# Patient Record
Sex: Male | Born: 1965 | Race: White | Hispanic: No | Marital: Married | Smoking: Never smoker
Health system: Southern US, Community
[De-identification: ages and names within clinical notes are randomized; demographics above are authoritative.]

## PROBLEM LIST (undated history)

## (undated) DIAGNOSIS — K922 Gastrointestinal hemorrhage, unspecified: Secondary | ICD-10-CM

## (undated) DIAGNOSIS — K5792 Diverticulitis of intestine, part unspecified, without perforation or abscess without bleeding: Secondary | ICD-10-CM

## (undated) DIAGNOSIS — I10 Essential (primary) hypertension: Secondary | ICD-10-CM

## (undated) DIAGNOSIS — Z5189 Encounter for other specified aftercare: Secondary | ICD-10-CM

## (undated) DIAGNOSIS — Z9889 Other specified postprocedural states: Secondary | ICD-10-CM

## (undated) DIAGNOSIS — D649 Anemia, unspecified: Secondary | ICD-10-CM

## (undated) DIAGNOSIS — K219 Gastro-esophageal reflux disease without esophagitis: Secondary | ICD-10-CM

## (undated) DIAGNOSIS — R112 Nausea with vomiting, unspecified: Secondary | ICD-10-CM

## (undated) HISTORY — DX: Diverticulitis of intestine, part unspecified, without perforation or abscess without bleeding: K57.92

## (undated) HISTORY — PX: LIPOMA EXCISION: SHX5283

## (undated) HISTORY — DX: Encounter for other specified aftercare: Z51.89

## (undated) HISTORY — PX: ANKLE FRACTURE SURGERY: SHX122

## (undated) HISTORY — DX: Gastrointestinal hemorrhage, unspecified: K92.2

## (undated) HISTORY — DX: Anemia, unspecified: D64.9

---

## 2003-04-25 HISTORY — PX: VASECTOMY: SHX75

## 2012-04-24 HISTORY — PX: HYDROCELE EXCISION / REPAIR: SUR1145

## 2017-01-01 ENCOUNTER — Telehealth: Payer: Self-pay

## 2017-01-01 NOTE — Telephone Encounter (Signed)
Pt's wife called to see about getting him set up for a Screening TCS. She said that she talked to you about he having it done when you did her TCS. Please advise

## 2017-01-02 NOTE — Telephone Encounter (Signed)
TRIAGE PATIENT FOR TCS.

## 2017-01-03 NOTE — Telephone Encounter (Signed)
Gastroenterology Pre-Procedure Review  Request Date: Requesting Physician: DR. Mahalia Longest  FIRST TCS  PATIENT REVIEW QUESTIONS: The patient responded to the following health history questions as indicated:    1. Diabetes Melitis: NO 2. Joint replacements in the past 12 months: NO 3. Major health problems in the past 3 months: NO 4. Has an artificial valve or MVP: NO 5. Has a defibrillator: NO 6. Has been advised in past to take antibiotics in advance of a procedure like teeth cleaning: NO 7. Family history of colon cancer: NO 8. Alcohol Use: NO 9. History of sleep apnea: NO 10. History of coronary artery or other vascular stents placed within the last 12 months: NO 11. History of any prior anesthesia complications: NO    MEDICATIONS & ALLERGIES:    Patient reports the following regarding taking any blood thinners:   Plavix? NO Aspirin? YES Coumadin? NO Brilinta? NO Xarelto? NO Eliquis? NO Pradaxa? NO Savaysa? NO Effient? NO  Patient confirms/reports the following medications:  Current Outpatient Prescriptions  Medication Sig Dispense Refill  . amLODipine (NORVASC) 10 MG tablet Take 10 mg by mouth daily.    Marland Kitchen aspirin EC 81 MG tablet Take 81 mg by mouth 2 (two) times daily.    . nebivolol (BYSTOLIC) 10 MG tablet Take 10 mg by mouth daily.    . pantoprazole (PROTONIX) 40 MG tablet Take 40 mg by mouth daily.     No current facility-administered medications for this visit.     Patient confirms/reports the following allergies:  No Known Allergies  No orders of the defined types were placed in this encounter.   AUTHORIZATION INFORMATION Primary Insurance: Madison Hickman,  ID #: [id number],  Group #: [id number] Pre-Cert / Josem Kaufmann required:  Pre-Cert / Auth #:   SCHEDULE INFORMATION: Procedure has been scheduled as follows:  Date: , Time:   Location:   This Gastroenterology Pre-Precedure Review Form is being routed to the following provider(s): Barney Drain, MD

## 2017-01-09 NOTE — Telephone Encounter (Signed)
Forwarding to Ginger who scheduled.

## 2017-01-09 NOTE — Telephone Encounter (Signed)
LMOM to call back

## 2017-01-09 NOTE — Telephone Encounter (Signed)
FULL LIQUIDS WITH BREAKFAST. CLEANPIQ SPLIT PREP.  Full Liquid Diet A high-calorie, high-protein supplement should be used to meet your nutritional requirements when the full liquid diet is continued for more than 2 or 3 days. If this diet is to be used for an extended period of time (more than 7 days), a multivitamin should be considered.  Breads and Starches  Allowed: None are allowed   Avoid: Any others.    Potatoes/Pasta/Rice  Allowed: ANY ITEM AS A SOUP OR SMALL PLATE OF MASHED POTATOES OR SCRAMBLED EGGS. (DO NOT EAT MORE THAN ONE SERVING ON THE DAY BEFORE COLONOSCOPY).      Vegetables  Allowed: Strained tomato or vegetable juice. Vegetables pureed in soup.   Avoid: Any others.    Fruit  Allowed: Any strained fruit juices and fruit drinks. Include 1 serving of citrus or vitamin C-enriched fruit juice daily.   Avoid: Any others.  Meat and Meat Substitutes  Allowed: Egg  Avoid: Any meat, fish, or fowl. All cheese.  Milk  Allowed: SOY Milk beverages, including milk shakes and instant breakfast mixes. Smooth yogurt.   Avoid: Any others. Avoid dairy products if not tolerated.    Soups and Combination Foods  Allowed: Broth, strained cream soups. Strained, broth-based soups.   Avoid: Any others.    Desserts and Sweets  Allowed: flavored gelatin, tapioca, ice cream, sherbet, smooth pudding, junket, fruit ices, frozen ice pops, pudding pops, frozen fudge pops, chocolate syrup. Sugar, honey, jelly, syrup.   Avoid: Any others.  Fats and Oils  Allowed: Margarine, butter, cream, sour cream, oils.   Avoid: Any others.  Beverages  Allowed: All.   Avoid: None.  Condiments  Allowed: Iodized salt, pepper, spices, flavorings. Cocoa powder.   Avoid: Any others.    SAMPLE MEAL PLAN Breakfast   cup orange juice.   1 OR 2 EGGS  1 cup milk.   1 cup beverage (coffee or tea).   Cream or sugar, if desired.    Midmorning Snack  2 SCRAMBLED OR HARD  BOILED EGG   Lunch  1 cup cream soup.    cup fruit juice.   1 cup milk.    cup custard.   1 cup beverage (coffee or tea).   Cream or sugar, if desired.    Midafternoon Snack  1 cup milk shake.  Dinner  1 cup cream soup.    cup fruit juice.   1 cup MILK    cup pudding.   1 cup beverage (coffee or tea).   Cream or sugar, if desired.  Evening Snack  1 cup supplement.  To increase calories, add sugar, cream, butter, or margarine if possible. Nutritional supplements will also increase the total calories.

## 2017-01-10 ENCOUNTER — Other Ambulatory Visit: Payer: Self-pay

## 2017-01-10 DIAGNOSIS — Z1211 Encounter for screening for malignant neoplasm of colon: Secondary | ICD-10-CM

## 2017-01-10 MED ORDER — CLENPIQ 10-3.5-12 MG-GM -GM/160ML PO SOLN: 1.0000 | Freq: Once | ORAL | 0 refills | Status: AC

## 2017-01-10 NOTE — Telephone Encounter (Signed)
Pt is set up for TCS on 01/30/17 @ 1:45 pm. Wife is aware and instructions are in the mail.

## 2017-01-30 ENCOUNTER — Encounter (HOSPITAL_COMMUNITY)
Admission: RE | Disposition: A | Discharge status: Home / Self Care | Payer: Self-pay | Source: Ambulatory Visit | Attending: Gastroenterology

## 2017-01-30 ENCOUNTER — Encounter (HOSPITAL_COMMUNITY): Payer: Self-pay | Admitting: *Deleted

## 2017-01-30 ENCOUNTER — Ambulatory Visit (HOSPITAL_COMMUNITY)
Admission: RE | Admit: 2017-01-30 | Discharge: 2017-01-30 | Disposition: A | Discharge status: Home / Self Care | Payer: BLUE CROSS/BLUE SHIELD | Source: Ambulatory Visit | Attending: Gastroenterology | Admitting: Gastroenterology

## 2017-01-30 DIAGNOSIS — Z79899 Other long term (current) drug therapy: Secondary | ICD-10-CM | POA: Insufficient documentation

## 2017-01-30 DIAGNOSIS — K635 Polyp of colon: Secondary | ICD-10-CM | POA: Diagnosis not present

## 2017-01-30 DIAGNOSIS — K219 Gastro-esophageal reflux disease without esophagitis: Secondary | ICD-10-CM | POA: Diagnosis not present

## 2017-01-30 DIAGNOSIS — K5730 Diverticulosis of large intestine without perforation or abscess without bleeding: Secondary | ICD-10-CM | POA: Diagnosis not present

## 2017-01-30 DIAGNOSIS — K648 Other hemorrhoids: Secondary | ICD-10-CM | POA: Insufficient documentation

## 2017-01-30 DIAGNOSIS — Q438 Other specified congenital malformations of intestine: Secondary | ICD-10-CM | POA: Insufficient documentation

## 2017-01-30 DIAGNOSIS — Z1211 Encounter for screening for malignant neoplasm of colon: Secondary | ICD-10-CM | POA: Diagnosis not present

## 2017-01-30 DIAGNOSIS — I10 Essential (primary) hypertension: Secondary | ICD-10-CM | POA: Diagnosis not present

## 2017-01-30 DIAGNOSIS — K644 Residual hemorrhoidal skin tags: Secondary | ICD-10-CM | POA: Insufficient documentation

## 2017-01-30 DIAGNOSIS — Z7982 Long term (current) use of aspirin: Secondary | ICD-10-CM | POA: Diagnosis not present

## 2017-01-30 HISTORY — DX: Gastro-esophageal reflux disease without esophagitis: K21.9

## 2017-01-30 HISTORY — PX: COLONOSCOPY: SHX5424

## 2017-01-30 HISTORY — DX: Essential (primary) hypertension: I10

## 2017-01-30 HISTORY — PX: POLYPECTOMY: SHX5525

## 2017-01-30 SURGERY — COLONOSCOPY
Anesthesia: Moderate Sedation

## 2017-01-30 MED ORDER — MEPERIDINE HCL 100 MG/ML IJ SOLN
INTRAMUSCULAR | Status: DC | PRN
Start: 2017-01-30 — End: 2017-01-30
  Administered 2017-01-30 (×2): 50 mg

## 2017-01-30 MED ORDER — MIDAZOLAM HCL 5 MG/5ML IJ SOLN
INTRAMUSCULAR | Status: AC
  Filled 2017-01-30: qty 10

## 2017-01-30 MED ORDER — MIDAZOLAM HCL 5 MG/5ML IJ SOLN
INTRAMUSCULAR | Status: DC | PRN
  Administered 2017-01-30 (×2): 2 mg via INTRAVENOUS
  Administered 2017-01-30: 1 mg via INTRAVENOUS

## 2017-01-30 MED ORDER — SODIUM CHLORIDE 0.9 % IV SOLN: INTRAVENOUS | Status: DC

## 2017-01-30 MED ORDER — PROMETHAZINE HCL 25 MG/ML IJ SOLN
INTRAMUSCULAR | Status: AC
  Filled 2017-01-30: qty 1

## 2017-01-30 MED ORDER — PROMETHAZINE HCL 25 MG/ML IJ SOLN: INTRAMUSCULAR | Status: DC | PRN

## 2017-01-30 MED ORDER — PROMETHAZINE HCL 25 MG/ML IJ SOLN
INTRAMUSCULAR | Status: DC | PRN
  Administered 2017-01-30: 25 mg via INTRAVENOUS

## 2017-01-30 MED ORDER — MEPERIDINE HCL 100 MG/ML IJ SOLN
INTRAMUSCULAR | Status: AC
  Filled 2017-01-30: qty 2

## 2017-01-30 MED ORDER — SODIUM CHLORIDE 0.9% FLUSH
INTRAVENOUS | Status: AC
  Filled 2017-01-30: qty 10

## 2017-01-30 NOTE — Discharge Instructions (Signed)
YOU HAD ONE SMALL POLYP REMOVED. You have diverticulosis IN YOUR LEFT COLON. YOU HAVE MODERATE INTERNAL HEMORRHOIDS.   DRINK WATER TO KEEP YOUR URINE LIGHT YELLOW.  FOLLOW A HIGH FIBER DIET. AVOID ITEMS THAT CAUSE BLOATING. See info below.  YOUR BIOPSY RESULTS WILL BE AVAILABLE IN MY CHART AFTER OCT 12 AND MY OFFICE WILL CONTACT YOU IN 10-14 DAYS WITH YOUR RESULTS.   USE PREPARATION H FOUR TIMES  A DAY IF NEEDED TO RELIEVE RECTAL PAIN/PRESSURE/BLEEDING.  Next colonoscopy in 5-10 years.  Colonoscopy Care After Read the instructions outlined below and refer to this sheet in the next week. These discharge instructions provide you with general information on caring for yourself after you leave the hospital. While your treatment has been planned according to the most current medical practices available, unavoidable complications occasionally occur. If you have any problems or questions after discharge, call DR. Court Gracia, [phone].  ACTIVITY  You may resume your regular activity, but move at a slower pace for the next 24 hours.   Take frequent rest periods for the next 24 hours.   Walking will help get rid of the air and reduce the bloated feeling in your belly (abdomen).   No driving for 24 hours (because of the medicine (anesthesia) used during the test).   You may shower.   Do not sign any important legal documents or operate any machinery for 24 hours (because of the anesthesia used during the test).    NUTRITION  Drink plenty of fluids.   You may resume your normal diet as instructed by your doctor.   Begin with a light meal and progress to your normal diet. Heavy or fried foods are harder to digest and may make you feel sick to your stomach (nauseated).   Avoid alcoholic beverages for 24 hours or as instructed.    MEDICATIONS  You may resume your normal medications.   WHAT YOU CAN EXPECT TODAY  Some feelings of bloating in the abdomen.   Passage of more gas than  usual.   Spotting of blood in your stool or on the toilet paper  .  IF YOU HAD POLYPS REMOVED DURING THE COLONOSCOPY:  Eat a soft diet IF YOU HAVE NAUSEA, BLOATING, ABDOMINAL PAIN, OR VOMITING.    FINDING OUT THE RESULTS OF YOUR TEST Not all test results are available during your visit. DR. Oneida Alar WILL CALL YOU WITHIN 14 DAYS OF YOUR PROCEDUE WITH YOUR RESULTS. Do not assume everything is normal if you have not heard from DR. Alistar Mcenery, CALL HER OFFICE AT [phone].  SEEK IMMEDIATE MEDICAL ATTENTION AND CALL THE OFFICE: [phone] IF:  You have more than a spotting of blood in your stool.   Your belly is swollen (abdominal distention).   You are nauseated or vomiting.   You have a temperature over 101F.   You have abdominal pain or discomfort that is severe or gets worse throughout the day.  High-Fiber Diet A high-fiber diet changes your normal diet to include more whole grains, legumes, fruits, and vegetables. Changes in the diet involve replacing refined carbohydrates with unrefined foods. The calorie level of the diet is essentially unchanged. The Dietary Reference Intake (recommended amount) for adult males is 38 grams per day. For adult females, it is 25 grams per day. Pregnant and lactating women should consume 28 grams of fiber per day. Fiber is the intact part of a plant that is not broken down during digestion. Functional fiber is fiber that has been isolated from  the plant to provide a beneficial effect in the body. PURPOSE  Increase stool bulk.   Ease and regulate bowel movements.   Lower cholesterol.   REDUCE RISK OF COLON CANCER  INDICATIONS THAT YOU NEED MORE FIBER  Constipation and hemorrhoids.   Uncomplicated diverticulosis (intestine condition) and irritable bowel syndrome.   Weight management.   As a protective measure against hardening of the arteries (atherosclerosis), diabetes, and cancer.   GUIDELINES FOR INCREASING FIBER IN THE DIET  Start  adding fiber to the diet slowly. A gradual increase of about 5 more grams (2 slices of whole-wheat bread, 2 servings of most fruits or vegetables, or 1 bowl of high-fiber cereal) per day is best. Too rapid an increase in fiber may result in constipation, flatulence, and bloating.   Drink enough water and fluids to keep your urine clear or pale yellow. Water, juice, or caffeine-free drinks are recommended. Not drinking enough fluid may cause constipation.   Eat a variety of high-fiber foods rather than one type of fiber.   Try to increase your intake of fiber through using high-fiber foods rather than fiber pills or supplements that contain small amounts of fiber.   The goal is to change the types of food eaten. Do not supplement your present diet with high-fiber foods, but replace foods in your present diet.   INCLUDE A VARIETY OF FIBER SOURCES  Replace refined and processed grains with whole grains, canned fruits with fresh fruits, and incorporate other fiber sources. White rice, white breads, and most bakery goods contain little or no fiber.   Brown whole-grain rice, buckwheat oats, and many fruits and vegetables are all good sources of fiber. These include: broccoli, Brussels sprouts, cabbage, cauliflower, beets, sweet potatoes, white potatoes (skin on), carrots, tomatoes, eggplant, squash, berries, fresh fruits, and dried fruits.   Cereals appear to be the richest source of fiber. Cereal fiber is found in whole grains and bran. Bran is the fiber-rich outer coat of cereal grain, which is largely removed in refining. In whole-grain cereals, the bran remains. In breakfast cereals, the largest amount of fiber is found in those with "bran" in their names. The fiber content is sometimes indicated on the label.   You may need to include additional fruits and vegetables each day.   In baking, for 1 cup white flour, you may use the following substitutions:   1 cup whole-wheat flour minus 2  tablespoons.   1/2 cup white flour plus 1/2 cup whole-wheat flour.   Polyps, Colon  A polyp is extra tissue that grows inside your body. Colon polyps grow in the large intestine. The large intestine, also called the colon, is part of your digestive system. It is a long, hollow tube at the end of your digestive tract where your body makes and stores stool. Most polyps are not dangerous. They are benign. This means they are not cancerous. But over time, some types of polyps can turn into cancer. Polyps that are smaller than a pea are usually not harmful. But larger polyps could someday become or may already be cancerous. To be safe, doctors remove all polyps and test them.   PREVENTION There is not one sure way to prevent polyps. You might be able to lower your risk of getting them if you:  Eat more fruits and vegetables and less fatty food.   Do not smoke.   Avoid alcohol.   Exercise every day.   Lose weight if you are overweight.   Eating  more calcium and folate can also lower your risk of getting polyps. Some foods that are rich in calcium are milk, cheese, and broccoli. Some foods that are rich in folate are chickpeas, kidney beans, and spinach.    Diverticulosis Diverticulosis is a common condition that develops when small pouches (diverticula) form in the wall of the colon. The risk of diverticulosis increases with age. It happens more often in people who eat a low-fiber diet. Most individuals with diverticulosis have no symptoms. Those individuals with symptoms usually experience belly (abdominal) pain, constipation, or loose stools (diarrhea).  HOME CARE INSTRUCTIONS  Increase the amount of fiber in your diet as directed by your caregiver or dietician. This may reduce symptoms of diverticulosis.   Drink at least 6 to 8 glasses of water each day to prevent constipation.   Try not to strain when you have a bowel movement.   Avoiding nuts and seeds to prevent complications is  NOT NECESSARY.   FOODS HAVING HIGH FIBER CONTENT INCLUDE:  Fruits. Apple, peach, pear, tangerine, raisins, prunes.   Vegetables. Brussels sprouts, asparagus, broccoli, cabbage, carrot, cauliflower, romaine lettuce, spinach, summer squash, tomato, winter squash, zucchini.   Starchy Vegetables. Baked beans, kidney beans, lima beans, split peas, lentils, potatoes (with skin).   Grains. Whole wheat bread, brown rice, bran flake cereal, plain oatmeal, white rice, shredded wheat, bran muffins.   SEEK IMMEDIATE MEDICAL CARE IF:  You develop increasing pain or severe bloating.   You have an oral temperature above 101F.   You develop vomiting or bowel movements that are bloody or black.   Hemorrhoids Hemorrhoids are dilated (enlarged) veins around the rectum. Sometimes clots will form in the veins. This makes them swollen and painful. These are called thrombosed hemorrhoids. Causes of hemorrhoids include:  Constipation.   Straining to have a bowel movement.   HEAVY LIFTING   HOME CARE INSTRUCTIONS  Eat a well balanced diet and drink 6 to 8 glasses of water every day to avoid constipation. You may also use a bulk laxative.   Avoid straining to have bowel movements.   Keep anal area dry and clean.   Do not use a donut shaped pillow or sit on the toilet for long periods. This increases blood pooling and pain.   Move your bowels when your body has the urge; this will require less straining and will decrease pain and pressure.

## 2017-01-30 NOTE — Op Note (Signed)
[facility] Patient Name: David Roberson Procedure Date: 01/30/2017 12:57 PM MRN: [id number] Date of Birth: Apr 28, 1965 Attending MD: Barney Drain MD, MD CSN: [id number] Age: 51 Admit Type: Outpatient Procedure:                Colonoscopy WITH COLD SNARE POLYPECTOMY Indications:              Screening for colorectal malignant neoplasm Providers:                Barney Drain MD, MD, Rosina Lowenstein, RN, Lurline Del,                            RN Referring MD:             [name], MD Medicines:                Promethazine 25 mg IV, Meperidine 100 mg IV,                            Midazolam 5 mg IV Complications:            No immediate complications. Estimated Blood Loss:     Estimated blood loss was minimal. Procedure:                Pre-Anesthesia Assessment:                           - Prior to the procedure, a History and Physical                            was performed, and patient medications and                            allergies were reviewed. The patient's tolerance of                            previous anesthesia was also reviewed. The risks                            and benefits of the procedure and the sedation                            options and risks were discussed with the patient.                            All questions were answered, and informed consent                            was obtained. Prior Anticoagulants: The patient has                            taken aspirin, last dose was day of procedure. ASA                            Grade Assessment: II - A patient with mild systemic  disease. After reviewing the risks and benefits,                            the patient was deemed in satisfactory condition to                            undergo the procedure. After obtaining informed                            consent, the colonoscope was passed under direct                            vision. Throughout the procedure, the  patient's                            blood pressure, pulse, and oxygen saturations were                            monitored continuously. The EC-[id number] ([id number])                            scope was introduced through the anus and advanced                            to the the cecum, identified by appendiceal orifice                            and ileocecal valve. The colonoscopy was somewhat                            difficult due to restricted mobility of the colon.                            Successful completion of the procedure was aided by                            COLOWRAP. The patient tolerated the procedure                            fairly well. The quality of the bowel preparation                            was excellent. The ileocecal valve, appendiceal                            orifice, and rectum were photographed. Scope In: 1:49:03 PM Scope Out: 2:05:34 PM Scope Withdrawal Time: 0 hours 12 minutes 21 seconds  Total Procedure Duration: 0 hours 16 minutes 31 seconds  Findings:      A 4 mm polyp was found in the proximal ascending colon. The polyp was       sessile. The polyp was removed with a hot snare. Resection and retrieval       were complete.      Multiple small and large-mouthed diverticula  were found in the       recto-sigmoid colon, sigmoid colon and descending colon.      External and internal hemorrhoids were found during retroflexion. The       hemorrhoids were moderate.      The recto-sigmoid colon was significantly redundant. Impression:               - One 4 mm polyp in the proximal ascending colon,                            removed with a hot snare. Resected and retrieved.                           - Diverticulosis in the recto-sigmoid colon, in the                            sigmoid colon and in the descending colon.                           - External and internal hemorrhoids.                           - REDUNDANT FIXED RECTOSIGMOID COLON Moderate  Sedation:      Moderate (conscious) sedation was administered by the endoscopy nurse       and supervised by the endoscopist. The following parameters were       monitored: oxygen saturation, heart rate, blood pressure, and response       to care. Total physician intraservice time was 28 minutes. Recommendation:           - Repeat colonoscopy in 5-10 years for surveillance                            WITH COLOWRAP.                           - High fiber diet.                           - Continue present medications.                           - Await pathology results.                           - Patient has a contact number available for                            emergencies. The signs and symptoms of potential                            delayed complications were discussed with the                            patient. Return to normal activities tomorrow.  Written discharge instructions were provided to the                            patient. Procedure Code(s):        --- Professional ---                           [phone], Colonoscopy, flexible; with removal of                            tumor(s), polyp(s), or other lesion(s) by snare                            technique                           99152, Moderate sedation services provided by the                            same physician or other qualified health care                            professional performing the diagnostic or                            therapeutic service that the sedation supports,                            requiring the presence of an independent trained                            observer to assist in the monitoring of the                            patient's level of consciousness and physiological                            status; initial 15 minutes of intraservice time,                            patient age 24 years or older                           [phone], Moderate sedation  services; each additional                            15 minutes intraservice time Diagnosis Code(s):        --- Professional ---                           Z12.11, Encounter for screening for malignant                            neoplasm of colon  D12.2, Benign neoplasm of ascending colon                           K64.8, Other hemorrhoids                           K57.30, Diverticulosis of large intestine without                            perforation or abscess without bleeding CPT copyright 2016 [employer]. All rights reserved. The codes documented in this report are preliminary and upon coder review may  be revised to meet current compliance requirements. Barney Drain, MD Barney Drain MD, MD 01/30/2017 2:17:24 PM This report has been signed electronically. Number of Addenda: 0

## 2017-01-30 NOTE — H&P (Signed)
Primary Care Physician:  System, Pcp Not In Primary Gastroenterologist:  Dr. Oneida Alar  Pre-Procedure History & Physical: HPI:  David Roberson is a 51 y.o. male here for COLON CANCER SCREENING.  Past Medical History:  Diagnosis Date  . GERD (gastroesophageal reflux disease)   . Hypertension     Past Surgical History:  Procedure Laterality Date  . ANKLE FRACTURE SURGERY Left   . HYDROCELE EXCISION / REPAIR  2014  . LIPOMA EXCISION     Neck  . VASECTOMY  2005    Prior to Admission medications   Medication Sig Start Date End Date Taking? Authorizing Provider  amLODipine (NORVASC) 10 MG tablet Take 10 mg by mouth daily.   Yes [provider]  aspirin EC 81 MG tablet Take 81 mg by mouth 2 (two) times daily.   Yes [provider]  Cholecalciferol (VITAMIN D3) 400 units CHEW Chew 1 tablet by mouth daily.   Yes [provider]  hydroxyurea (HYDREA) 500 MG capsule Take 500 mg by mouth daily. May take with food to minimize GI side effects.   Yes [provider]  nebivolol (BYSTOLIC) 10 MG tablet Take 10 mg by mouth daily.   Yes [provider]  vitamin E (VITAMIN E) 400 UNIT capsule Take 400 Units by mouth daily.   Yes [provider]  pantoprazole (PROTONIX) 40 MG tablet Take 40 mg by mouth daily.    [provider]    Allergies as of 01/10/2017  . (No Known Allergies)    Family History  Problem Relation Age of Onset  . Hypertension Mother   . Hypertension Father   . Stroke Paternal Grandmother   . Heart disease Paternal Grandfather     Social History   Social History  . Marital status: Married    Spouse name: N/A  . Number of children: N/A  . Years of education: N/A   Occupational History  . Not on file.   Social History Main Topics  . Smoking status: Never Smoker  . Smokeless tobacco: Current User    Types: Chew  . Alcohol use Yes     Comment: 5-6pk. a daily  . Drug use: No  . Sexual activity: Not on  file   Other Topics Concern  . Not on file   Social History Narrative  . No narrative on file    Review of Systems: See HPI, otherwise negative ROS   Physical Exam: There were no vitals taken for this visit. General:   Alert,  pleasant and cooperative in NAD Head:  Normocephalic and atraumatic. Neck:  Supple; Lungs:  Clear throughout to auscultation.    Heart:  Regular rate and rhythm. Abdomen:  Soft, nontender and nondistended. Normal bowel sounds, without guarding, and without rebound.   Neurologic:  Alert and  oriented x4;  grossly normal neurologically.  Impression/Plan:     SCREENING  Plan:  1. TCS TODAY DISCUSSED PROCEDURE, BENEFITS, & RISKS: < 1% chance of medication reaction, bleeding, perforation, or rupture of spleen/liver.

## 2017-01-30 NOTE — Progress Notes (Signed)
Patient complains of being hot and is sweaty. He is also confused at times. Vital signs taken and Dr. Oneida Alar notified. Dr. Oneida Alar states it is likely a side effect of Phenergan. No further orders at this time. Will continue to monitor.

## 2017-02-02 ENCOUNTER — Encounter (HOSPITAL_COMMUNITY): Payer: Self-pay | Admitting: Gastroenterology

## 2017-02-14 ENCOUNTER — Telehealth: Payer: Self-pay | Admitting: Gastroenterology

## 2017-02-14 NOTE — Telephone Encounter (Signed)
Please call pt. HE had ONE simple adenoma removed BUT NOT RETRIEVED. FOLLOW A HIGH FIBER DIET. NEXT TCS IN 5-10.

## 2017-02-15 NOTE — Telephone Encounter (Signed)
Patient made aware of results and recommendations.  Please nic repeat tcs in 5 years.

## 2017-02-15 NOTE — Telephone Encounter (Signed)
ON RECALL FOR 5 YR TCS

## 2017-02-19 NOTE — Telephone Encounter (Signed)
David Roberson, please fax tcs report to Dr. Mahalia Longest [phone]

## 2017-02-20 NOTE — Telephone Encounter (Signed)
FAXED

## 2020-07-26 ENCOUNTER — Encounter (HOSPITAL_COMMUNITY): Payer: Self-pay | Admitting: Surgery

## 2020-07-26 ENCOUNTER — Other Ambulatory Visit: Payer: Self-pay

## 2020-07-27 ENCOUNTER — Inpatient Hospital Stay (HOSPITAL_COMMUNITY): Payer: 59

## 2020-07-27 ENCOUNTER — Other Ambulatory Visit: Payer: Self-pay

## 2020-07-27 ENCOUNTER — Inpatient Hospital Stay (HOSPITAL_COMMUNITY): Payer: 59 | Attending: Hematology | Admitting: Hematology

## 2020-07-27 VITALS — BP 167/90 | HR 56 | Temp 98.4°F | Resp 18 | Ht 71.0 in | Wt 211.5 lb

## 2020-07-27 DIAGNOSIS — D72829 Elevated white blood cell count, unspecified: Secondary | ICD-10-CM | POA: Insufficient documentation

## 2020-07-27 DIAGNOSIS — D509 Iron deficiency anemia, unspecified: Secondary | ICD-10-CM | POA: Insufficient documentation

## 2020-07-27 DIAGNOSIS — D75839 Thrombocytosis, unspecified: Secondary | ICD-10-CM

## 2020-07-27 DIAGNOSIS — D72825 Bandemia: Secondary | ICD-10-CM

## 2020-07-27 DIAGNOSIS — D696 Thrombocytopenia, unspecified: Secondary | ICD-10-CM | POA: Insufficient documentation

## 2020-07-27 DIAGNOSIS — D471 Chronic myeloproliferative disease: Secondary | ICD-10-CM

## 2020-07-27 DIAGNOSIS — R768 Other specified abnormal immunological findings in serum: Secondary | ICD-10-CM

## 2020-07-27 LAB — CBC WITH DIFFERENTIAL/PLATELET
Abs Immature Granulocytes: 0.79 10*3/uL — ABNORMAL HIGH (ref 0.00–0.07)
Basophils Absolute: 1.2 10*3/uL — ABNORMAL HIGH (ref 0.0–0.1)
Basophils Relative: 6 %
Eosinophils Absolute: 0.6 10*3/uL — ABNORMAL HIGH (ref 0.0–0.5)
Eosinophils Relative: 3 %
HCT: 40.7 % (ref 39.0–52.0)
Hemoglobin: 10.7 g/dL — ABNORMAL LOW (ref 13.0–17.0)
Immature Granulocytes: 4 %
Lymphocytes Relative: 15 %
Lymphs Abs: 2.8 10*3/uL (ref 0.7–4.0)
MCH: 16.3 pg — ABNORMAL LOW (ref 26.0–34.0)
MCHC: 26.3 g/dL — ABNORMAL LOW (ref 30.0–36.0)
MCV: 62 fL — ABNORMAL LOW (ref 80.0–100.0)
Monocytes Absolute: 1.4 10*3/uL — ABNORMAL HIGH (ref 0.1–1.0)
Monocytes Relative: 8 %
Neutro Abs: 11.9 10*3/uL — ABNORMAL HIGH (ref 1.7–7.7)
Neutrophils Relative %: 64 %
Platelets: 872 10*3/uL — ABNORMAL HIGH (ref 150–400)
RBC: 6.56 MIL/uL — ABNORMAL HIGH (ref 4.22–5.81)
RDW: 23 % — ABNORMAL HIGH (ref 11.5–15.5)
WBC: 18.6 10*3/uL — ABNORMAL HIGH (ref 4.0–10.5)
nRBC: 0.5 % — ABNORMAL HIGH (ref 0.0–0.2)

## 2020-07-27 LAB — COMPREHENSIVE METABOLIC PANEL
ALT: 27 U/L (ref 0–44)
AST: 24 U/L (ref 15–41)
Albumin: 4.4 g/dL (ref 3.5–5.0)
Alkaline Phosphatase: 67 U/L (ref 38–126)
Anion gap: 10 (ref 5–15)
BUN: 18 mg/dL (ref 6–20)
CO2: 23 mmol/L (ref 22–32)
Calcium: 9.1 mg/dL (ref 8.9–10.3)
Chloride: 105 mmol/L (ref 98–111)
Creatinine, Ser: 1.04 mg/dL (ref 0.61–1.24)
GFR, Estimated: >60 mL/min (ref >60)
Glucose, Bld: 88 mg/dL (ref 70–99)
Potassium: 4.7 mmol/L (ref 3.5–5.1)
Sodium: 138 mmol/L (ref 135–145)
Total Bilirubin: 0.7 mg/dL (ref 0.3–1.2)
Total Protein: 6.9 g/dL (ref 6.5–8.1)

## 2020-07-27 LAB — IRON AND TIBC
Iron: 21 ug/dL — ABNORMAL LOW (ref 45–182)
Saturation Ratios: 4 % — ABNORMAL LOW (ref 17.9–39.5)
TIBC: 528 ug/dL — ABNORMAL HIGH (ref 250–450)
UIBC: 507 ug/dL

## 2020-07-27 LAB — FERRITIN: Ferritin: 3 ng/mL — ABNORMAL LOW (ref 24–336)

## 2020-07-27 LAB — VITAMIN B12: Vitamin B-12: 1173 pg/mL — ABNORMAL HIGH (ref 180–914)

## 2020-07-27 LAB — LACTATE DEHYDROGENASE: LDH: 361 U/L — ABNORMAL HIGH (ref 98–192)

## 2020-07-27 LAB — FOLATE: Folate: 6.4 ng/mL (ref >5.9)

## 2020-07-27 NOTE — Patient Instructions (Signed)
[place] at [facility] Discharge Instructions  You were seen today by Dr. Delton Coombes and Tarri Abernethy PA-C for your thrombocytosis (elevated platelets), anemia (low hemoglobin), and possible myelofibrosis (bone marrow disorder).  We would like to run several other tests to decide on future treatment plans.    LABS: Labs today before leaving [employer]   OTHER TESTS:  - CT abdomen and pelvis - Bone marrow biopsy at [place]:  - CHANGE the dose of your Asprin to 81 mg (instead of 325 mg) - Discuss with Dr. Volanda Napoleon (neurology) if you still need to be on Plavix  FOLLOW-UP APPOINTMENT: Return for follow up appointment after your bone marrow biopsy and CT abdomen   Thank you for choosing [place] at [facility] to provide your oncology and hematology care.  To afford each patient quality time with our provider, please arrive at least 15 minutes before your scheduled appointment time.   If you have a lab appointment with the [facility] please come in thru the Main Entrance and check in at the main information desk.  You need to re-schedule your appointment should you arrive 10 or more minutes late.  We strive to give you quality time with our providers, and arriving late affects you and other patients whose appointments are after yours.  Also, if you no show three or more times for appointments you may be dismissed from the clinic at the providers discretion.     Again, thank you for choosing [facility].  Our hope is that these requests will decrease the amount of time that you wait before being seen by our physicians.       _____________________________________________________________  Should you have questions after your visit to [facility], please contact our office at [phone] and follow the prompts.  Our office hours are 8:00 a.m. and 4:30 p.m. Monday - Friday.  Please note that  voicemails left after 4:00 p.m. may not be returned until the following business day.  We are closed weekends and major holidays.  You do have access to a nurse 24-7, just call the main number to the clinic [phone] and do not press any options, hold on the line and a nurse will answer the phone.    For prescription refill requests, have your pharmacy contact our office and allow 72 hours.    Due to Covid, you will need to wear a mask upon entering the hospital. If you do not have a mask, a mask will be given to you at the Main Entrance upon arrival. For doctor visits, patients may have 1 support person age 8 or older with them. For treatment visits, patients can not have anyone with them due to social distancing guidelines and our immunocompromised population.

## 2020-07-27 NOTE — Progress Notes (Signed)
[name], [place] [address]   CLINIC:  Medical Oncology/Hematology  CONSULT NOTE  Patient Care Team: [name] as PCP - General (Internal Medicine)  CHIEF COMPLAINTS/PURPOSE OF CONSULTATION:  Essential thrombocytosis  HISTORY OF PRESENTING ILLNESS:  David Roberson 55 y.o. male is here because of thrombocytosis, referred by [facility].  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  Bone marrow biopsy in November 2018 (evaluated by hematopathologist at [facility]) showed 60% cellularity with atypical megakaryocytes, reticulin stain shows increased marrow fibrosis indicative of early phase primary myelofibrosis, kappa predominant increase in plasma cells (5%).  Patient was briefly on Hydroxyurea for less than 2 months in 2018, has been on long-term Aspirin since diagnosis.  Patient was unfortunately lost to follow-up after December 2018.  He presents today due to persistent abnormalities noted in CBC obtained on 07/09/2020, which showed hemoglobin 10.8, but with increased RBC 6.69 and low MCV 60.0, RDW 22.1.  Polychromasia, elliptocytes, and spherocytes were present.  CBC also noted WBC 20.2 (neutrophilia with neutrophils 12.9) with platelets 880, possibly in the setting of acute illness/abdominal complaints (discharged on Cipro for possible diverticulitis).  In the interim history since he was last seen by a hematologist, [name] experienced a severe GI bleed in September 2021, with Hgb < 6.0, required RBC transfusion x2.  GI bleed was secondary to duodenal ulcers related to overuse of NSAIDs, resolved s/p clipping of duodenal ulcers.  At the time of GI bleeding, patient had multiple TIAs.  CTA neck obtained on 01/07/2020 showed interval occlusion of the distal end of left vertebral arterty (compared to CTA head on  01/05/2020), which was concerning for thrombosis as well as moderate to high-grade vertebral artery stenosis.   MRA neck (01/07/2020) showed nonopacification of the bilateral V4 segments of the vertebral arteries concerning for occlusions.  MRI brain (01/07/2020) showed acute ischemia in the brainstem and cerebellum consistent with vertebral artery occlusion/stenosis.  Due to acute CVA, patient was placed on Plavix as well as aspirin 325 mg.  Over the past six months since his GI bleed and CVAs, patient has been slowly improving.  He reports persistent fatigue with energy level 60%, but improved from September.  He denies any current signs or symptoms of blood loss, no hematemesis, hematochezia, melena.  He reports that he has had abnormal cravings for oranges since his GI bleed, reports that he did not previously eat much citrus.  He continues to have small "episodes" that are similar to his TIAs, he had one episode in February 2022, described as a burning sensation is in his left eye which is his typical prodromal symptom before having mild left arm numbness which self-resolved.  He also reports aquagenic pruritus, and a feeling of "crawling under his skin," most noted after a hot shower.  Vasomotor symptoms also evident in burning sensation on the bottom of his feet, which improved after he restarted aspirin.  He denies vision changes and changes in the color of his skin.  No thrombotic events apart from TIA as noted above.  No new onset or worsening peripheral edema.  He denies new bone pain or recent fractures.  He does report early satiety for the past 2 years.  He denies recent chest pain on exertion, shortness of breath on minimal exertion, pre-syncopal episodes, or palpitations.  He denies B-symptoms such as fever, chills, weight  loss, and night sweats.  No new masses or lymphadenopathy per his report.  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  He has hypertension.  He is a non-smoker, but previously used chewing tobacco.  No alcohol or illicit drug use.  He is retired from work as a Engineer, structural.  MEDICAL HISTORY:  Past Medical History:  Diagnosis Date  . Anemia   . Blood transfusion without reported diagnosis   . Diverticulitis   . GERD (gastroesophageal reflux disease)   . GI bleed   . Hypertension   . PONV (postoperative nausea and vomiting)    with colonoscopy    SURGICAL HISTORY: Past Surgical History:  Procedure Laterality Date  . ANKLE FRACTURE SURGERY Left   . COLONOSCOPY N/A 01/30/2017   Procedure: COLONOSCOPY;  Surgeon: Danie Binder, MD;  Location: [facility];  Service: Endoscopy;  Laterality: N/A;  145  . HYDROCELE EXCISION / REPAIR  2014  . LIPOMA EXCISION     Neck  . POLYPECTOMY  01/30/2017   Procedure: POLYPECTOMY;  Surgeon: Danie Binder, MD;  Location: [facility];  Service: Endoscopy;;  Ascending colon  . VASECTOMY  2005    SOCIAL HISTORY: Social History   Socioeconomic History  . Marital status: Married    Spouse name: Not on file  . Number of children: 4  . Years of education: Not on file  . Highest education level: Not on file  Occupational History  . Occupation: Retired  Tobacco Use  . Smoking status: Never Smoker  . Smokeless tobacco: Former Systems developer    Types: Secondary school teacher  . Vaping Use: Never used  Substance and Sexual Activity  . Alcohol use: Yes    Comment: maybe 1 beer a night  . Drug use: No  . Sexual activity: Yes  Other Topics Concern  . Not on file  Social History Narrative  . Not on file   Social Determinants of Health   Financial Resource Strain: Low Risk   . Difficulty of Paying Living Expenses: Not hard at all  Food Insecurity: No Food Insecurity  . Worried About Charity fundraiser in the Last Year: Never true  . Ran Out of  Food in the Last Year: Never true  Transportation Needs: No Transportation Needs  . Lack of Transportation (Medical): No  . Lack of Transportation (Non-Medical): No  Physical Activity: Insufficiently Active  . Days of Exercise per Week: 2 days  . Minutes of Exercise per Session: 30 min  Stress: No Stress Concern Present  . Feeling of Stress : Not at all  Social Connections: Moderately Isolated  . Frequency of Communication with Friends and Family: More than three times a week  . Frequency of Social Gatherings with Friends and Family: Three times a week  . Attends Religious Services: Never  . Active Member of Clubs or Organizations: No  . Attends Archivist Meetings: Never  . Marital Status: Married  Human resources officer Violence: Not At Risk  . Fear of Current or Ex-Partner: No  . Emotionally Abused: No  . Physically Abused: No  . Sexually Abused: No    FAMILY HISTORY: Family History  Problem Relation Age of Onset  . Hypertension Mother   . Hypertension Father   . Stroke Paternal Grandmother   .  Heart disease Paternal Grandfather     ALLERGIES:  is allergic to hydralazine, losartan, metronidazole, and piperacillin sod-tazobactam so.  MEDICATIONS:  Current Outpatient Medications  Medication Sig Dispense Refill  . ALPRAZolam (XANAX) 0.25 MG tablet Take 1 tablet by mouth as needed.    Marland Kitchen amLODipine (NORVASC) 10 MG tablet Take 10 mg by mouth daily.    Mar[place] Kitchen aspirin 325 MG tablet Take 325 mg by mouth daily.    . clopidogrel (PLAVIX) 75 MG tablet Take 1 tablet by mouth daily.    Marland Kitchen guanFACINE (INTUNIV) 1 MG TB24 ER tablet Take 1 tablet by mouth daily.    . Nebivolol HCl 20 MG TABS Take 1 tablet by mouth daily.    . NP THYROID 60 MG tablet Take 60 mg by mouth daily.    . pantoprazole (PROTONIX) 40 MG tablet Take 40 mg by mouth daily.    . rosuvastatin (CRESTOR) 40 MG tablet Take 1 tablet by mouth daily.    . Vitamin D, Cholecalciferol, 25 MCG (1000 UT) TABS Take 1,000 Units by  mouth daily.    . Omega-3 Fatty Acids (FISH OIL) 1000 MG CAPS Take by mouth.    Marland Kitchen tiZANidine (ZANAFLEX) 4 MG tablet Take 4 mg by mouth 3 (three) times daily.     No current facility-administered medications for this visit.    REVIEW OF SYSTEMS:   Review of Systems  Constitutional: Positive for fatigue. Negative for appetite change, chills, diaphoresis, fever and unexpected weight change.  HENT:   Negative for lump/mass and nosebleeds.   Eyes: Negative for eye problems.  Respiratory: Negative for cough, hemoptysis and shortness of breath.   Cardiovascular: Negative for chest pain, leg swelling and palpitations.  Gastrointestinal: Negative for abdominal pain, blood in stool, constipation, diarrhea, nausea and vomiting.       Early satiety  Genitourinary: Negative for hematuria.   Skin: Negative.   Neurological: Negative for dizziness, headaches and light-headedness.  Hematological: Does not bruise/bleed easily.      PHYSICAL EXAMINATION: ECOG PERFORMANCE STATUS: 1 - Symptomatic but completely ambulatory  Vitals:   07/27/20 1258  BP: (!) 167/90  Pulse: (!) 56  Resp: 18  Temp: 98.4 F (36.9 C)  SpO2: 99%   Filed Weights   07/27/20 1258  Weight: 211 lb 8 oz (95.9 kg)    Physical Exam Constitutional:      Appearance: Normal appearance.  HENT:     Head: Normocephalic and atraumatic.     Mouth/Throat:     Mouth: Mucous membranes are moist.  Eyes:     Extraocular Movements: Extraocular movements intact.     Pupils: Pupils are equal, round, and reactive to light.  Cardiovascular:     Rate and Rhythm: Normal rate and regular rhythm.     Pulses: Normal pulses.     Heart sounds: Normal heart sounds.  Pulmonary:     Effort: Pulmonary effort is normal.     Breath sounds: Normal breath sounds.  Abdominal:     General: Bowel sounds are normal.     Palpations: Abdomen is soft.     Tenderness: There is no abdominal tenderness.  Musculoskeletal:        General: No swelling.      Right lower leg: No edema.     Left lower leg: No edema.  Lymphadenopathy:     Cervical: No cervical adenopathy.  Skin:    General: Skin is warm and dry.  Neurological:     General: No focal deficit  present.     Mental Status: He is alert and oriented to person, place, and time.  Psychiatric:        Mood and Affect: Mood normal.        Behavior: Behavior normal.      LABORATORY DATA:  I have extensively reviewed the laboratory data sent by referring provider and via [place], as documented elsewhere in this note   RADIOGRAPHIC STUDIES: I have extensively reviewed the radiology reports via [place], as elsewhere summarized in this note.  ASSESSMENT: 1.  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   -Bone marrow biopsy in November 2018 (evaluated by hematopathologist at [facility]) showed 60% cellularity with atypical megakaryocytes, reticulin stain shows increased marrow fibrosis indicative of early phase primary myelofibrosis, kappa predominant increase in plasma cells (5%).   -Patient was briefly on Hydroxyurea for less than 2 months in 2018, has been on long-term Aspirin since diagnosis.   -Patient was unfortunately lost to follow-up after December 2018 -Patient had multiple TIAs in September 2021, unclear if this is due to severe anemia due to concurrent GI bleeding, vertebral artery stenosis, or thrombosis related to essential thrombocytosis -Vasomotor symptoms include burning sensation of bilateral plantar feet, improved after restarting aspirin -WBC 18.6 (absolute neutrophils 11.9, absolute basophils 1.2), RBC 6.56, hemoglobin 10.7, MCV 62.0, platelets 872 -LDH elevated at 361, normal folate 6.3, elevated B12 1,173  2.  Microcytic anemia -Hemoglobin was previously  noted to be normal, Hgb 17.3 in January 2021, but with low MCV 72.0 -Patient had severe GI bleed (duodenal ulcer secondary to NSAID use) in September 2021, with hemoglobin < 6.0, required RBC transfusion x2 -Hemoglobin has not yet fully recovered in the 6 months following GI bleed -Labs today (07/27/2020) hemoglobin 10.7, MCV 62.0 -Severe iron deficiency with ferritin 3, iron saturation 4%, serum iron 21 -LDH elevated at 361, normal folate 6.3, elevated B12 1,173    PLAN:  1.  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  Microcytic anemia -Iron studies obtained today significant for severe iron deficiency as noted above -Pending labs include copper, SPEP, FLC, methylmalonic acid, IFE -Check SPEP and free light chains, in view of persistent anemia and slightly increased plasma cells on bone marrow biopsy in 2018 -Schedule patient for IV iron infusion -Suspect the patient may have some underlying thalassemia, due to low MCV 60.0 out of proportion to level of low hemoglobin 10.8 (as well as increased RBC 6.69) - we will check hemoglobin electrophoresis after iron has been normalized -Referral for bone marrow biopsy as above -Obtain skeletal survey due to elevated free light chains -We will repeat iron panel and CBC after next appointment   PLAN SUMMARY & DISPOSITION: -Labs today -CT abdomen -Bone marrow biopsy -RTC after bone marrow biopsy  All  questions were answered. The patient knows to call the clinic with  any problems, questions or concerns.   Medical decision making: Moderate (chronic problem with exacerbation requiring further work-up, review of external notes, review of previous results, ordering new tests)  Time spent on visit: I spent 40 minutes counseling the patient face to face. The total time spent in the appointment was 55 minutes and more than 50% was on counseling.  I, Tarri Abernethy PA-C, have seen this patient in conjunction with Dr. Derek Jack. Greater than 50% of visit was performed by Dr. Delton Coombes.  Addendum: I have independently evaluated this patient and agree with HPI written by Casey Burkitt, PA-C.  This patient has a history of JAK2 positive myeloproliferative disorder, initially presented as thrombocytosis.  As he had no risk factors, he was not started on hydroxyurea.  He presents with severe microcytic anemia with leukocytosis and thrombocytosis.  He might also have underlying thalassemia trait.  Because of significant changes in his blood counts, I have recommended bone marrow aspiration and biopsy to evaluate for any progression of his reticulin fibrosis in the bone marrow.  We will see him back after the bone marrow biopsy.    Derek Jack, MD 08/21/20 1:49 PM  NOTE:  On 07/30/2020, I Eugene Garnet Pennington PA-C) spoke at length with Dr. Rip Harbour (the patient's neurologist) to discuss Mr. Gali prior Margretta Sidle and CTA Head/Neck results from September 2021.  Per our discussion, the exact etiology of the patient's TIAs is likely mixed.  He certainly has some atherosclerotic changes of his basilar artery and vertebral arteries, which may have been exacerbated due to hypovolemia in the setting of acute GI bleed, as well as possible thrombotic event in the setting of essential thrombocytosis.  As discussed with Dr. Volanda Napoleon, we would like the patient to remain on aspirin, but will defer  to him regarding Plavix.

## 2020-07-28 LAB — KAPPA/LAMBDA LIGHT CHAINS
Kappa free light chain: 354.4 mg/L — ABNORMAL HIGH (ref 3.3–19.4)
Kappa, lambda light chain ratio: 59.07 — ABNORMAL HIGH (ref 0.26–1.65)
Lambda free light chains: 6 mg/L (ref 5.7–26.3)

## 2020-07-29 LAB — PROTEIN ELECTROPHORESIS, SERUM
A/G Ratio: 1.6 (ref 0.7–1.7)
Albumin ELP: 4 g/dL (ref 2.9–4.4)
Alpha-1-Globulin: 0.2 g/dL (ref 0.0–0.4)
Alpha-2-Globulin: 0.8 g/dL (ref 0.4–1.0)
Beta Globulin: 1 g/dL (ref 0.7–1.3)
Gamma Globulin: 0.5 g/dL (ref 0.4–1.8)
Globulin, Total: 2.5 g/dL (ref 2.2–3.9)
Total Protein ELP: 6.5 g/dL (ref 6.0–8.5)

## 2020-07-30 LAB — METHYLMALONIC ACID, SERUM: Methylmalonic Acid, Quantitative: 188 nmol/L (ref 0–378)

## 2020-07-30 LAB — COPPER, SERUM: Copper: 77 ug/dL (ref 69–132)

## 2020-08-01 LAB — JAK2 V617F, W REFLEX TO CALR/E12/MPL

## 2020-08-02 ENCOUNTER — Other Ambulatory Visit: Payer: Self-pay

## 2020-08-02 ENCOUNTER — Ambulatory Visit (HOSPITAL_COMMUNITY): Payer: 59

## 2020-08-02 ENCOUNTER — Inpatient Hospital Stay (HOSPITAL_COMMUNITY): Payer: 59

## 2020-08-02 ENCOUNTER — Encounter (HOSPITAL_COMMUNITY): Payer: Self-pay

## 2020-08-02 VITALS — BP 138/78 | HR 50 | Temp 96.8°F | Resp 18

## 2020-08-02 DIAGNOSIS — R768 Other specified abnormal immunological findings in serum: Secondary | ICD-10-CM

## 2020-08-02 DIAGNOSIS — D509 Iron deficiency anemia, unspecified: Secondary | ICD-10-CM | POA: Diagnosis not present

## 2020-08-02 MED ORDER — ACETAMINOPHEN 325 MG PO TABS
ORAL_TABLET | ORAL | Status: AC
  Filled 2020-08-02: qty 2

## 2020-08-02 MED ORDER — DIPHENHYDRAMINE HCL 25 MG PO CAPS
ORAL_CAPSULE | ORAL | Status: AC
  Filled 2020-08-02: qty 1

## 2020-08-02 MED ORDER — DIPHENHYDRAMINE HCL 25 MG PO CAPS
25.0000 mg | ORAL_CAPSULE | Freq: Once | ORAL | Status: AC
  Administered 2020-08-02: 25 mg via ORAL

## 2020-08-02 MED ORDER — ACETAMINOPHEN 325 MG PO TABS
650.0000 mg | ORAL_TABLET | Freq: Once | ORAL | Status: AC
  Administered 2020-08-02: 650 mg via ORAL

## 2020-08-02 MED ORDER — SODIUM CHLORIDE 0.9 % IV SOLN
300.0000 mg | Freq: Once | INTRAVENOUS | Status: AC
  Administered 2020-08-02: 300 mg via INTRAVENOUS
  Filled 2020-08-02: qty 15

## 2020-08-02 MED ORDER — SODIUM CHLORIDE 0.9 % IV SOLN: Freq: Once | INTRAVENOUS | Status: AC

## 2020-08-02 NOTE — Progress Notes (Signed)
Patient tolerated iron infusion with no complaints voiced.  Peripheral IV site clean and dry with good blood return noted before and after infusion.  Band aid applied.  VSS with discharge and left in satisfactory condition with no s/s of distress noted.   

## 2020-08-02 NOTE — Patient Instructions (Signed)
[place] at [facility]  Discharge Instructions:  You received your venofer infusion today, return as scheduled. _______________________________________________________________  Thank you for choosing [place] at [facility] to provide your oncology and hematology care.  To afford each patient quality time with our providers, please arrive at least 15 minutes before your scheduled appointment.  You need to re-schedule your appointment if you arrive 10 or more minutes late.  We strive to give you quality time with our providers, and arriving late affects you and other patients whose appointments are after yours.  Also, if you no show three or more times for appointments you may be dismissed from the clinic.  Again, thank you for choosing [place] at [place] hope is that these requests will allow you access to exceptional care and in a timely manner. _______________________________________________________________  If you have questions after your visit, please contact our office at [phone] between the hours of 8:30 a.m. and 5:00 p.m. Voicemails left after 4:30 p.m. will not be returned until the following business day. _______________________________________________________________  For prescription refill requests, have your pharmacy contact our office. _______________________________________________________________  Recommendations made by the consultant and any test results will be sent to your referring physician. _______________________________________________________________

## 2020-08-02 NOTE — Progress Notes (Signed)
Patient presents today for 300mg  Venofer infusion. Vital signs stable. Patient has complaints of new pain on the Right leg that he wanted to let the RPennington PA know about. New onset. PA notified and at the bedside to speak with patient.

## 2020-08-04 LAB — IMMUNOFIXATION ELECTROPHORESIS
IgA: 64 mg/dL — ABNORMAL LOW (ref 90–386)
IgG (Immunoglobin G), Serum: 487 mg/dL — ABNORMAL LOW (ref 603–1613)
IgM (Immunoglobulin M), Srm: 20 mg/dL (ref 20–172)
Total Protein ELP: 6.9 g/dL (ref 6.0–8.5)

## 2020-08-09 ENCOUNTER — Other Ambulatory Visit: Payer: Self-pay

## 2020-08-09 ENCOUNTER — Encounter (HOSPITAL_COMMUNITY): Payer: Self-pay

## 2020-08-09 ENCOUNTER — Inpatient Hospital Stay (HOSPITAL_COMMUNITY): Payer: 59

## 2020-08-09 VITALS — BP 128/84 | HR 58 | Temp 98.3°F | Resp 18

## 2020-08-09 DIAGNOSIS — D509 Iron deficiency anemia, unspecified: Secondary | ICD-10-CM

## 2020-08-09 MED ORDER — SODIUM CHLORIDE 0.9 % IV SOLN
300.0000 mg | Freq: Once | INTRAVENOUS | Status: AC
  Administered 2020-08-09: 300 mg via INTRAVENOUS
  Filled 2020-08-09: qty 15

## 2020-08-09 MED ORDER — SODIUM CHLORIDE 0.9 % IV SOLN: Freq: Once | INTRAVENOUS | Status: AC

## 2020-08-09 MED ORDER — SODIUM CHLORIDE 0.9% FLUSH
10.0000 mL | Freq: Once | INTRAVENOUS | Status: AC | PRN
  Administered 2020-08-09: 10 mL

## 2020-08-09 MED ORDER — DIPHENHYDRAMINE HCL 25 MG PO CAPS
25.0000 mg | ORAL_CAPSULE | Freq: Once | ORAL | Status: AC
  Administered 2020-08-09: 25 mg via ORAL
  Filled 2020-08-09: qty 1

## 2020-08-09 MED ORDER — ACETAMINOPHEN 325 MG PO TABS
650.0000 mg | ORAL_TABLET | Freq: Once | ORAL | Status: AC
  Administered 2020-08-09: 650 mg via ORAL
  Filled 2020-08-09: qty 2

## 2020-08-09 NOTE — Progress Notes (Signed)
No complaints with left peripheral IV site.  Tegaderm and tape did not hold the IV site in place and the IV slipped out.  Site clean and dry with no complaints of pain.  No bruising or swelling noted at site.  New peripheral IV started in right hand without difficulty.    Patient tolerated iron infusion with no complaints voiced.  Peripheral IV site clean and dry with good blood return noted before and after infusion.  Band aid applied.  VSS with discharge and left in satisfactory condition with no s/s of distress noted.

## 2020-08-09 NOTE — Patient Instructions (Signed)
[place] at [facility]  Discharge Instructions:  You received an iron infusion today.  Keep next appointment as scheduled.  _______________________________________________________________  Thank you for choosing [place] at [facility] to provide your oncology and hematology care.  To afford each patient quality time with our providers, please arrive at least 15 minutes before your scheduled appointment.  You need to re-schedule your appointment if you arrive 10 or more minutes late.  We strive to give you quality time with our providers, and arriving late affects you and other patients whose appointments are after yours.  Also, if you no show three or more times for appointments you may be dismissed from the clinic.  Again, thank you for choosing [place] at [place] hope is that these requests will allow you access to exceptional care and in a timely manner. _______________________________________________________________  If you have questions after your visit, please contact our office at [phone] between the hours of 8:30 a.m. and 5:00 p.m. Voicemails left after 4:30 p.m. will not be returned until the following business day. _______________________________________________________________  For prescription refill requests, have your pharmacy contact our office. _______________________________________________________________  Recommendations made by the consultant and any test results will be sent to your referring physician. _______________________________________________________________

## 2020-08-12 ENCOUNTER — Ambulatory Visit (HOSPITAL_COMMUNITY): Payer: 59

## 2020-08-13 ENCOUNTER — Other Ambulatory Visit: Payer: Self-pay | Admitting: Student

## 2020-08-16 ENCOUNTER — Encounter (HOSPITAL_COMMUNITY): Payer: Self-pay

## 2020-08-16 ENCOUNTER — Ambulatory Visit (HOSPITAL_COMMUNITY)
Admission: RE | Admit: 2020-08-16 | Discharge: 2020-08-16 | Disposition: A | Discharge status: Home / Self Care | Payer: 59 | Source: Ambulatory Visit | Attending: Physician Assistant | Admitting: Physician Assistant

## 2020-08-16 ENCOUNTER — Other Ambulatory Visit: Payer: Self-pay

## 2020-08-16 ENCOUNTER — Ambulatory Visit (HOSPITAL_COMMUNITY)
Admission: RE | Admit: 2020-08-16 | Discharge: 2020-08-16 | Disposition: A | Discharge status: Home / Self Care | Payer: 59 | Source: Ambulatory Visit

## 2020-08-16 DIAGNOSIS — C9000 Multiple myeloma not having achieved remission: Secondary | ICD-10-CM | POA: Insufficient documentation

## 2020-08-16 DIAGNOSIS — D72829 Elevated white blood cell count, unspecified: Secondary | ICD-10-CM | POA: Diagnosis present

## 2020-08-16 DIAGNOSIS — D75839 Thrombocytosis, unspecified: Secondary | ICD-10-CM | POA: Diagnosis not present

## 2020-08-16 DIAGNOSIS — D509 Iron deficiency anemia, unspecified: Secondary | ICD-10-CM | POA: Insufficient documentation

## 2020-08-16 HISTORY — DX: Other specified postprocedural states: R11.2

## 2020-08-16 HISTORY — DX: Other specified postprocedural states: Z98.890

## 2020-08-16 LAB — CBC
HCT: 44.5 % (ref 39.0–52.0)
Hemoglobin: 11.9 g/dL — ABNORMAL LOW (ref 13.0–17.0)
MCH: 17.3 pg — ABNORMAL LOW (ref 26.0–34.0)
MCHC: 26.7 g/dL — ABNORMAL LOW (ref 30.0–36.0)
MCV: 64.8 fL — ABNORMAL LOW (ref 80.0–100.0)
Platelets: 974 10*3/uL (ref 150–400)
RBC: 6.87 MIL/uL — ABNORMAL HIGH (ref 4.22–5.81)
RDW: 28.2 % — ABNORMAL HIGH (ref 11.5–15.5)
WBC: 18.5 10*3/uL — ABNORMAL HIGH (ref 4.0–10.5)
nRBC: 0.9 % — ABNORMAL HIGH (ref 0.0–0.2)

## 2020-08-16 MED ORDER — MIDAZOLAM HCL 2 MG/2ML IJ SOLN
INTRAMUSCULAR | Status: AC
  Filled 2020-08-16: qty 4

## 2020-08-16 MED ORDER — NALOXONE HCL 0.4 MG/ML IJ SOLN
INTRAMUSCULAR | Status: AC
  Filled 2020-08-16: qty 1

## 2020-08-16 MED ORDER — MIDAZOLAM HCL 2 MG/2ML IJ SOLN
INTRAMUSCULAR | Status: AC | PRN
  Administered 2020-08-16: 1 mg via INTRAVENOUS
  Administered 2020-08-16: 2 mg via INTRAVENOUS
  Administered 2020-08-16: 1 mg via INTRAVENOUS

## 2020-08-16 MED ORDER — SODIUM CHLORIDE 0.9 % IV SOLN: INTRAVENOUS | Status: DC

## 2020-08-16 MED ORDER — FENTANYL CITRATE (PF) 100 MCG/2ML IJ SOLN
INTRAMUSCULAR | Status: AC
  Filled 2020-08-16: qty 4

## 2020-08-16 MED ORDER — FENTANYL CITRATE (PF) 100 MCG/2ML IJ SOLN
INTRAMUSCULAR | Status: AC | PRN
  Administered 2020-08-16 (×3): 50 ug via INTRAVENOUS

## 2020-08-16 MED ORDER — FLUMAZENIL 0.5 MG/5ML IV SOLN
INTRAVENOUS | Status: AC
  Filled 2020-08-16: qty 5

## 2020-08-16 MED ORDER — LIDOCAINE HCL (PF) 1 % IJ SOLN
INTRAMUSCULAR | Status: AC | PRN
  Administered 2020-08-16: 10 mL via INTRADERMAL

## 2020-08-16 NOTE — Procedures (Signed)
Interventional Radiology Procedure Note  Procedure: CT BM ASP AND CORE    Complications: None  Estimated Blood Loss:  MIN  Findings: 11 G CORE ASP AND CORE    Tamera Punt, MD

## 2020-08-16 NOTE — Consult Note (Signed)
Chief Complaint: Patient was seen in consultation today for CT guided bone marrow biopsy  Referring Physician(s): David Roberson  Supervising Physician: David Roberson  Patient Status: David Roberson - Out-pt  History of Present Illness: David Roberson is a 55 y.o. male with history of anemia, diverticulitis, GERD, prior GI bleed, and hypertension.  He presents now with leukocytosis, thrombocytosis, elevated kappa free light chains and kappa lambda light chain ratio as well as decreased immunoglobulins.  He is scheduled today for CT-guided bone marrow biopsy for further evaluation, rule out myelofibrosis/MGUS.  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  Past Medical History:  Diagnosis Date  . Anemia   . Blood transfusion without reported diagnosis   . Diverticulitis   . GERD (gastroesophageal reflux disease)   . GI bleed   . Hypertension     Past Surgical History:  Procedure Laterality Date  . ANKLE FRACTURE SURGERY Left   . COLONOSCOPY N/A 01/30/2017   Procedure: COLONOSCOPY;  Surgeon: Danie Binder, MD;  Location: [facility];  Service: Endoscopy;  Laterality: N/A;  145  . HYDROCELE EXCISION / REPAIR  2014  . LIPOMA EXCISION     Neck  . POLYPECTOMY  01/30/2017   Procedure: POLYPECTOMY;  Surgeon: Danie Binder, MD;  Location: [facility];  Service: Endoscopy;;  Ascending colon  . VASECTOMY  2005    Allergies: Hydralazine, Losartan, Metronidazole, and Piperacillin sod-tazobactam so  Medications: Prior to Admission medications   Medication Sig Start Date End Date Taking? Authorizing Provider  ALPRAZolam Duanne Moron) 0.25 MG tablet Take 1 tablet by mouth as needed.    [provider]  amLODipine (NORVASC) 10 MG tablet Take 10 mg by mouth daily.    [provider]  aspirin 325 MG tablet Take 325 mg by mouth daily.    [provider]  clopidogrel (PLAVIX) 75 MG tablet Take 1 tablet by mouth daily.    [provider]  guanFACINE (INTUNIV) 1 MG TB24 ER tablet Take 1 tablet by mouth daily.    [provider]  Nebivolol HCl 20 MG TABS Take 1 tablet by mouth daily. 06/23/20   [provider]  NP THYROID 60 MG tablet Take 60 mg by mouth daily. 07/26/20   [provider]  pantoprazole (PROTONIX) 40 MG tablet Take 40 mg by mouth daily.    [provider]  rosuvastatin (CRESTOR) 40 MG tablet Take 1 tablet by mouth daily. 01/09/20   [provider]  tiZANidine (ZANAFLEX) 4 MG tablet Take 4 mg by mouth 3 (three) times daily. 07/31/20   [provider]  Vitamin D, Cholecalciferol, 25 MCG (1000 UT) TABS Take 1,000 Units by mouth daily.    [provider]     Family History  Problem Relation Age of Onset  . Hypertension Mother   . Hypertension Father   . Stroke Paternal Grandmother   . Heart disease Paternal Grandfather     Social History   Socioeconomic History  . Marital status: Married    Spouse name: Not on file  . Number of children: 4  . Years of education: Not on file  . Highest education level: Not on file  Occupational History  . Occupation: Retired  Tobacco Use  . Smoking status: Never Smoker  . Smokeless tobacco: Former Systems developer    Types: Secondary school teacher  . Vaping Use: Never used  Substance and  Sexual Activity  . Alcohol use: Yes    Comment: maybe 1 beer a night  . Drug use: No  . Sexual activity: Yes  Other Topics Concern  . Not on file  Social History Narrative  . Not on file   Social Determinants of Health   Financial Resource Strain: Low Risk   . Difficulty of Paying Living Expenses: Not hard at all  Food Insecurity: No Food Insecurity  . Worried About Charity fundraiser in the Last Year: Never true  . Ran Out of Food in the Last Year: Never true  Transportation Needs: No Transportation Needs  . Lack of Transportation  (Medical): No  . Lack of Transportation (Non-Medical): No  Physical Activity: Insufficiently Active  . Days of Exercise per Week: 2 days  . Minutes of Exercise per Session: 30 min  Stress: No Stress Concern Present  . Feeling of Stress : Not at all  Social Connections: Moderately Isolated  . Frequency of Communication with Friends and Family: More than three times a week  . Frequency of Social Gatherings with Friends and Family: Three times a week  . Attends Religious Services: Never  . Active Member of Clubs or Organizations: No  . Attends Archivist Meetings: Never  . Marital Status: Married      Review of Systems denies fever, headache, chest pain, dyspnea, cough, abdominal pain, nausea, vomiting or bleeding.  He does have some occasional back pain.  Vital Signs: pending    Physical Exam awake, alert.  Chest clear to auscultation bilaterally.  Heart with regular rate and rhythm.  Abdomen soft, positive bowel sounds, nontender.  No lower extremity edema.  Imaging: No results found.  Labs:  CBC: Recent Labs    07/27/20 1454  WBC 18.6*  HGB 10.7*  HCT 40.7  PLT 872*    COAGS: No results for input(s): INR, APTT in the last 8760 hours.  BMP: Recent Labs    07/27/20 1454  NA 138  K 4.7  CL 105  CO2 23  GLUCOSE 88  BUN 18  CALCIUM 9.1  CREATININE 1.04  GFRNONAA >60    LIVER FUNCTION TESTS: Recent Labs    07/27/20 1454  BILITOT 0.7  AST 24  ALT 27  ALKPHOS 67  PROT 6.9  ALBUMIN 4.4    TUMOR MARKERS: No results for input(s): AFPTM, CEA, CA199, CHROMGRNA in the last 8760 hours.  Assessment and Plan: 55 y.o. male with history of anemia, diverticulitis, GERD, prior GI bleed, and hypertension.  He presents now with leukocytosis, thrombocytosis, elevated kappa free light chains and kappa lambda light chain ratio as well as decreased immunoglobulins.  He is scheduled today for CT-guided bone marrow biopsy for further evaluation, rule out  myelofibrosis/MGUS.  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  All of the questions were answered and there is agreement to proceed.  Consent signed and in chart.     Thank you for this interesting consult.  I greatly enjoyed meeting David Roberson and look forward to participating in their care.  A copy of this report was sent to the requesting provider on this date.  Electronically Signed: D. Rowe Berkeley, PA-C 08/16/2020, 9:42 AM   I spent a total of 20 minutes  in face to face in clinical consultation, greater than 50% of which was counseling/coordinating care for CT-guided bone marrow biopsy

## 2020-08-16 NOTE — Discharge Instructions (Addendum)
Bone Marrow Aspiration and Bone Marrow Biopsy, Adult, Care After This sheet gives you information about how to care for yourself after your procedure. Your health care provider may also give you more specific instructions. If you have problems or questions, contact your health care provider. What can I expect after the procedure? After the procedure, it is common to have:  Mild pain and tenderness.  Swelling.  Bruising. Follow these instructions at home: Puncture site care  Follow instructions from your health care provider about how to take care of the puncture site. Make sure you: ? Wash your hands with soap and water before and after you change your bandage (dressing). If soap and water are not available, use hand sanitizer. ? Change your dressing as told by your health care provider.  Check your puncture site every day for signs of infection. Check for: ? More redness, swelling, or pain. ? Fluid or blood. ? Warmth. ? Pus or a bad smell.   Activity  Return to your normal activities as told by your health care provider. Ask your health care provider what activities are safe for you.  Do not lift anything that is heavier than 10 lb (4.5 kg), or the limit that you are told, until your health care provider says that it is safe.  Do not drive for 24 hours if you were given a sedative during your procedure. General instructions  Take over-the-counter and prescription medicines only as told by your health care provider.  Do not take baths, swim, or use a hot tub until your health care provider approves. Ask your health care provider if you may take showers. You may only be allowed to take sponge baths.  If directed, put ice on the affected area. To do this: ? Put ice in a plastic bag. ? Place a towel between your skin and the bag. ? Leave the ice on for 20 minutes, 2-3 times a day.  Keep all follow-up visits as told by your health care provider. This is important.   Contact a  health care provider if:  Your pain is not controlled with medicine.  You have a fever.  You have more redness, swelling, or pain around the puncture site.  You have fluid or blood coming from the puncture site.  Your puncture site feels warm to the touch.  You have pus or a bad smell coming from the puncture site. Summary  After the procedure, it is common to have mild pain, tenderness, swelling, and bruising.  Follow instructions from your health care provider about how to take care of the puncture site and what activities are safe for you.  Take over-the-counter and prescription medicines only as told by your health care provider.  Contact a health care provider if you have any signs of infection, such as fluid or blood coming from the puncture site. This information is not intended to replace advice given to you by your health care provider. Make sure you discuss any questions you have with your health care provider. Document Revised: 08/27/2018 Document Reviewed: 08/27/2018 Elsevier Patient Education  2021 [employer] Moderate Conscious Sedation, Adult, Care After This sheet gives you information about how to care for yourself after your procedure. Your health care provider may also give you more specific instructions. If you have problems or questions, contact your health care provider. What can I expect after the procedure? After the procedure, it is common to have:  Sleepiness for several hours.  Impaired judgment for several   hours.  Difficulty with balance.  Vomiting if you eat too soon. Follow these instructions at home: For the time period you were told by your health care provider:  Rest.  Do not participate in activities where you could fall or become injured.  Do not drive or use machinery.  Do not drink alcohol.  Do not take sleeping pills or medicines that cause drowsiness.  Do not make important decisions or sign legal documents.  Do not take  care of children on your own.      Eating and drinking  Follow the diet recommended by your health care provider.  Drink enough fluid to keep your urine pale yellow.  If you vomit: ? Drink water, juice, or soup when you can drink without vomiting. ? Make sure you have little or no nausea before eating solid foods.   General instructions  Take over-the-counter and prescription medicines only as told by your health care provider.  Have a responsible adult stay with you for the time you are told. It is important to have someone help care for you until you are awake and alert.  Do not smoke.  Keep all follow-up visits as told by your health care provider. This is important. Contact a health care provider if:  You are still sleepy or having trouble with balance after 24 hours.  You feel light-headed.  You keep feeling nauseous or you keep vomiting.  You develop a rash.  You have a fever.  You have redness or swelling around the IV site. Get help right away if:  You have trouble breathing.  You have new-onset confusion at home. Summary  After the procedure, it is common to feel sleepy, have impaired judgment, or feel nauseous if you eat too soon.  Rest after you get home. Know the things you should not do after the procedure.  Follow the diet recommended by your health care provider and drink enough fluid to keep your urine pale yellow.  Get help right away if you have trouble breathing or new-onset confusion at home. This information is not intended to replace advice given to you by your health care provider. Make sure you discuss any questions you have with your health care provider. Document Revised: 08/08/2019 Document Reviewed: 03/06/2019 Elsevier Patient Education  2021 Elsevier Inc.  

## 2020-08-17 ENCOUNTER — Ambulatory Visit (HOSPITAL_COMMUNITY): Payer: 59

## 2020-08-18 LAB — SURGICAL PATHOLOGY

## 2020-08-19 ENCOUNTER — Other Ambulatory Visit: Payer: Self-pay

## 2020-08-19 ENCOUNTER — Inpatient Hospital Stay (HOSPITAL_COMMUNITY): Payer: 59

## 2020-08-19 VITALS — BP 154/83 | HR 53 | Temp 97.0°F | Resp 16 | Wt 205.0 lb

## 2020-08-19 DIAGNOSIS — D509 Iron deficiency anemia, unspecified: Secondary | ICD-10-CM

## 2020-08-19 MED ORDER — SODIUM CHLORIDE 0.9 % IV SOLN
Freq: Once | INTRAVENOUS | Status: AC
Start: 2020-08-19 — End: 2020-08-19

## 2020-08-19 MED ORDER — ACETAMINOPHEN 325 MG PO TABS
650.0000 mg | ORAL_TABLET | Freq: Once | ORAL | Status: AC
Start: 2020-08-19 — End: 2020-08-19
  Administered 2020-08-19: 650 mg via ORAL
  Filled 2020-08-19: qty 2

## 2020-08-19 MED ORDER — SODIUM CHLORIDE 0.9 % IV SOLN
300.0000 mg | Freq: Once | INTRAVENOUS | Status: AC
  Administered 2020-08-19: 300 mg via INTRAVENOUS
  Filled 2020-08-19: qty 300

## 2020-08-19 MED ORDER — DIPHENHYDRAMINE HCL 25 MG PO CAPS
25.0000 mg | ORAL_CAPSULE | Freq: Once | ORAL | Status: AC
  Administered 2020-08-19: 25 mg via ORAL
  Filled 2020-08-19: qty 1

## 2020-08-19 NOTE — Progress Notes (Signed)
Iron infusion given per orders. Patient tolerated it well without problems. Vitals stable and discharged home from clinic ambulatory. Follow up as scheduled.  

## 2020-08-19 NOTE — Patient Instructions (Signed)
David Roberson  Discharge Instructions: Thank you for choosing [facility] to provide your oncology and hematology care.  If you have a lab appointment with the [facility], please come in thru the Main Entrance and check in at the main information desk.  Wear comfortable clothing and clothing appropriate for easy access to any Portacath or PICC line.   We strive to give you quality time with your provider. You may need to reschedule your appointment if you arrive late (15 or more minutes).  Arriving late affects you and other patients whose appointments are after yours.  Also, if you miss three or more appointments without notifying the office, you may be dismissed from the clinic at the provider's discretion.      For prescription refill requests, have your pharmacy contact our office and allow 72 hours for refills to be completed.    Today you received the following: iron infusion    .   Items with * indicate a potential emergency and should be followed up as soon as possible or go to the Emergency Department if any problems should occur.    Should you have questions after your visit or need to cancel or reschedule your appointment, please contact [facility] [phone]  and follow the prompts.  Office hours are 8:00 a.m. to 4:30 p.m. Monday - Friday. Please note that voicemails left after 4:00 p.m. may not be returned until the following business day.  We are closed weekends and major holidays. You have access to a nurse at all times for urgent questions. Please call the main number to the clinic [phone] and follow the prompts.  For any non-urgent questions, you may also contact your provider using MyChart. We now offer e-Visits for anyone 28 and older to request care online for non-urgent symptoms. For details visit [email or web address].   Also download the MyChart app! Go to the app store, search "MyChart", open the app, select [facility],  and log in with your MyChart username and password.  Due to Covid, a mask is required upon entering the hospital/clinic. If you do not have a mask, one will be given to you upon arrival. For doctor visits, patients may have 1 support person aged 5 or older with them. For treatment visits, patients cannot have anyone with them due to current Covid guidelines and our immunocompromised population.

## 2020-08-21 DIAGNOSIS — D471 Chronic myeloproliferative disease: Secondary | ICD-10-CM | POA: Insufficient documentation

## 2020-08-23 ENCOUNTER — Ambulatory Visit (HOSPITAL_COMMUNITY): Payer: 59 | Admitting: Hematology

## 2020-08-24 ENCOUNTER — Encounter (HOSPITAL_COMMUNITY): Payer: Self-pay | Admitting: Hematology

## 2020-08-27 ENCOUNTER — Other Ambulatory Visit (HOSPITAL_COMMUNITY): Payer: Self-pay | Admitting: *Deleted

## 2020-08-27 DIAGNOSIS — C9000 Multiple myeloma not having achieved remission: Secondary | ICD-10-CM

## 2020-08-27 DIAGNOSIS — D471 Chronic myeloproliferative disease: Secondary | ICD-10-CM

## 2020-09-13 ENCOUNTER — Encounter (HOSPITAL_COMMUNITY): Payer: Self-pay

## 2020-09-13 ENCOUNTER — Encounter (HOSPITAL_COMMUNITY): Payer: 59

## 2020-09-16 ENCOUNTER — Inpatient Hospital Stay (HOSPITAL_COMMUNITY): Payer: 59

## 2020-09-16 ENCOUNTER — Inpatient Hospital Stay (HOSPITAL_COMMUNITY): Payer: 59 | Attending: Hematology | Admitting: Hematology

## 2020-09-16 ENCOUNTER — Other Ambulatory Visit: Payer: Self-pay

## 2020-09-16 VITALS — BP 139/76 | HR 57 | Temp 97.2°F | Resp 18 | Wt 205.0 lb

## 2020-09-16 DIAGNOSIS — D45 Polycythemia vera: Secondary | ICD-10-CM | POA: Diagnosis not present

## 2020-09-16 DIAGNOSIS — D500 Iron deficiency anemia secondary to blood loss (chronic): Secondary | ICD-10-CM | POA: Diagnosis not present

## 2020-09-16 DIAGNOSIS — D509 Iron deficiency anemia, unspecified: Secondary | ICD-10-CM | POA: Insufficient documentation

## 2020-09-16 DIAGNOSIS — C9030 Solitary plasmacytoma not having achieved remission: Secondary | ICD-10-CM | POA: Insufficient documentation

## 2020-09-16 DIAGNOSIS — D472 Monoclonal gammopathy: Secondary | ICD-10-CM | POA: Diagnosis not present

## 2020-09-16 LAB — CBC WITH DIFFERENTIAL/PLATELET
Basophils Absolute: 1.4 10*3/uL — ABNORMAL HIGH (ref 0.0–0.1)
Basophils Relative: 8 %
Eosinophils Absolute: 0.3 10*3/uL (ref 0.0–0.5)
Eosinophils Relative: 2 %
HCT: 51 % (ref 39.0–52.0)
Hemoglobin: 14.1 g/dL (ref 13.0–17.0)
Lymphocytes Relative: 21 %
Lymphs Abs: 3.6 10*3/uL (ref 0.7–4.0)
MCH: 18.5 pg — ABNORMAL LOW (ref 26.0–34.0)
MCHC: 27.6 g/dL — ABNORMAL LOW (ref 30.0–36.0)
MCV: 66.8 fL — ABNORMAL LOW (ref 80.0–100.0)
Monocytes Absolute: 0.5 10*3/uL (ref 0.1–1.0)
Monocytes Relative: 3 %
Neutro Abs: 11.2 10*3/uL — ABNORMAL HIGH (ref 1.7–7.7)
Neutrophils Relative %: 66 %
Platelets: 733 10*3/uL — ABNORMAL HIGH (ref 150–400)
RBC: 7.64 MIL/uL — ABNORMAL HIGH (ref 4.22–5.81)
RDW: 26 % — ABNORMAL HIGH (ref 11.5–15.5)
WBC: 17 10*3/uL — ABNORMAL HIGH (ref 4.0–10.5)
nRBC: 0.2 % (ref 0.0–0.2)

## 2020-09-16 LAB — COMPREHENSIVE METABOLIC PANEL
ALT: 28 U/L (ref 0–44)
AST: 23 U/L (ref 15–41)
Albumin: 4.3 g/dL (ref 3.5–5.0)
Alkaline Phosphatase: 73 U/L (ref 38–126)
Anion gap: 7 (ref 5–15)
BUN: 20 mg/dL (ref 6–20)
CO2: 24 mmol/L (ref 22–32)
Calcium: 8.7 mg/dL — ABNORMAL LOW (ref 8.9–10.3)
Chloride: 104 mmol/L (ref 98–111)
Creatinine, Ser: 1.16 mg/dL (ref 0.61–1.24)
GFR, Estimated: >60 mL/min (ref >60)
Glucose, Bld: 103 mg/dL — ABNORMAL HIGH (ref 70–99)
Potassium: 4 mmol/L (ref 3.5–5.1)
Sodium: 135 mmol/L (ref 135–145)
Total Bilirubin: 0.7 mg/dL (ref 0.3–1.2)
Total Protein: 6.7 g/dL (ref 6.5–8.1)

## 2020-09-16 LAB — IRON AND TIBC
Iron: 28 ug/dL — ABNORMAL LOW (ref 45–182)
Saturation Ratios: 6 % — ABNORMAL LOW (ref 17.9–39.5)
TIBC: 466 ug/dL — ABNORMAL HIGH (ref 250–450)
UIBC: 438 ug/dL

## 2020-09-16 LAB — FERRITIN: Ferritin: 9 ng/mL — ABNORMAL LOW (ref 24–336)

## 2020-09-16 NOTE — Progress Notes (Signed)
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  He was last seen by Dr. Delton Coombes and Tarri Abernethy, PA-C on 07/27/2020.  He has been doing fairly well since his last visit.  He received IV Venofer 1,200 mg in four divided doses, and tolerated the infusions well.  He no longer is experiencing pica.  His energy is improved.  He denies any current signs or symptoms of blood loss - no hematochezia, melena, or hematemesis.  He has previously had episodes of aquagenic pruritis and erythromelalgias, but denies any of these symptoms in the past two months. He denies any blood clots since his last visit.  No new neurologic symptoms - no tinnitus, blurry vision, or new-onset paresthesias.  He denies B-symptoms apart from fatigue.  He has still been taking aspirin 325 mg, and he is reminded once again to reduce the dose to aspirin 81 mg daily. He remains on Plavix for his history of CVA.  He has 75% energy and 100% appetite. He endorses that he is maintaining a stable weight.    REVIEW OF SYSTEMS:  Review of Systems  Constitutional: Positive for fatigue (energy 75%). Negative for appetite change, chills, diaphoresis, fever and unexpected weight change.  HENT:   Negative for lump/mass and nosebleeds.   Eyes: Negative for eye problems.  Respiratory: Negative for cough, hemoptysis and shortness of breath.   Cardiovascular: Negative for chest pain, leg swelling and palpitations.  Gastrointestinal:  Positive for nausea. Negative for abdominal pain, blood in stool, constipation, diarrhea and vomiting.  Genitourinary: Negative for hematuria.   Skin: Negative.   Neurological: Negative for dizziness, headaches and light-headedness.  Hematological: Does not bruise/bleed easily.      PAST MEDICAL/SURGICAL HISTORY:  Past Medical History:  Diagnosis Date  . Anemia   . Blood transfusion without reported diagnosis   . Diverticulitis   . GERD (gastroesophageal reflux disease)   . GI bleed   . Hypertension   . PONV (postoperative nausea and vomiting)    with colonoscopy   Past Surgical History:  Procedure Laterality Date  . ANKLE FRACTURE SURGERY Left   . COLONOSCOPY N/A 01/30/2017   Procedure: COLONOSCOPY;  Surgeon: Danie Binder, MD;  Location: [facility];  Service: Endoscopy;  Laterality: N/A;  145  . HYDROCELE EXCISION / REPAIR  2014  . LIPOMA EXCISION     Neck  . POLYPECTOMY  01/30/2017   Procedure: POLYPECTOMY;  Surgeon: Danie Binder, MD;  Location: [facility];  Service: Endoscopy;;  Ascending colon  . VASECTOMY  2005     SOCIAL HISTORY:  Social History   Socioeconomic History  . Marital status: Married    Spouse name: Not on file  . Number of children: 4  . Years of education: Not on file  . Highest education level: Not on file  Occupational History  . Occupation: Retired  Tobacco Use  . Smoking status: Never Smoker  . Smokeless tobacco: Former Systems developer    Types: Secondary school teacher  . Vaping Use: Never used  Substance and Sexual Activity  . Alcohol use: Yes    Comment: maybe 1 beer a night  . Drug use: No  . Sexual activity: Yes  Other Topics Concern  . Not on file  Social History Narrative  . Not on file   Social Determinants of Health   Financial Resource Strain: Low Risk   . Difficulty of Paying Living Expenses: Not hard at all  Food Insecurity: No Food Insecurity  . Worried About Charity fundraiser in the Last Year: Never true  . Ran Out of  Food in the Last Year: Never true  Transportation Needs: No Transportation Needs  . Lack of Transportation (Medical): No  . Lack of Transportation (Non-Medical): No  Physical Activity: Insufficiently Active  . Days of Exercise per Week: 2 days  . Minutes of Exercise per Session: 30 min  Stress: No Stress Concern Present  . Feeling of Stress : Not at all  Social Connections: Moderately Isolated  . Frequency of Communication with Friends and Family: More than three times a week  . Frequency of Social Gatherings with Friends and Family: Three times a week  . Attends Religious Services: Never  . Active Member of Clubs or Organizations: No  . Attends Archivist Meetings: Never  . Marital Status: Married  Human resources officer Violence: Not At Risk  . Fear of Current or Ex-Partner: No  . Emotionally Abused: No  . Physically Abused: No  . Sexually Abused: No    FAMILY HISTORY:  Family History  Problem Relation Age of Onset  . Hypertension Mother   . Hypertension Father   . Stroke Paternal Grandmother   . Heart disease Paternal Grandfather     CURRENT MEDICATIONS:  Outpatient Encounter Medications as of 09/16/2020  Medication Sig  . amLODipine (NORVASC) 10 MG tablet Take 10 mg by mouth daily.  Marland Kitchen aspirin 325 MG tablet Take 325 mg by mouth daily.  . clopidogrel (PLAVIX) 75 MG tablet Take 1 tablet by mouth daily.  Marland Kitchen guanFACINE (INTUNIV) 1 MG TB24 ER tablet Take 1 tablet by mouth daily.  . Nebivolol HCl 20 MG TABS Take 1 tablet by mouth daily.  . NP THYROID 60 MG tablet Take 60 mg by mouth daily.  . Omega-3 Fatty Acids (FISH OIL) 1000 MG CAPS Take by mouth.  . pantoprazole (PROTONIX) 40 MG tablet Take 40 mg by mouth daily.  . rosuvastatin (CRESTOR) 40 MG tablet Take 1 tablet by mouth daily.  Marland Kitchen sulfacetamide (BLEPH-10) 10 % ophthalmic solution SMARTSIG:1-2 Drop(s) Right Eye Every 2 Hours PRN  . tiZANidine (ZANAFLEX) 4 MG tablet Take 4 mg by mouth 3 (three) times daily.  . Vitamin  D, Cholecalciferol, 25 MCG (1000 UT) TABS Take 1,000 Units by mouth daily.  Marland Kitchen ALPRAZolam (XANAX) 0.25 MG tablet Take 1 tablet by mouth as needed. (Patient not taking: Reported on 09/16/2020)   No facility-administered encounter medications on file as of 09/16/2020.    ALLERGIES:  Allergies  Allergen Reactions  . Hydralazine Other (See Comments)  . Losartan Other (See Comments)    Caused elevated potassium  . Metronidazole Itching  . Piperacillin Sod-Tazobactam So Itching     PHYSICAL EXAM:  ECOG PERFORMANCE STATUS: 1 - Symptomatic but completely ambulatory  Vitals:   09/16/20 1001  BP: 139/76  Pulse: (!) 57  Resp: 18  Temp: (!) 97.2 F (36.2 C)  SpO2: 97%   Filed Weights   09/16/20 1001  Weight: 205 lb 0.4 oz (93 kg)  Physical Exam Constitutional:      Appearance: Normal appearance.  HENT:     Head: Normocephalic and atraumatic.     Mouth/Throat:     Mouth: Mucous membranes are moist.  Eyes:     Extraocular Movements: Extraocular movements intact.     Pupils: Pupils are equal, round, and reactive to light.  Cardiovascular:     Rate and Rhythm: Normal rate and regular rhythm.     Pulses: Normal pulses.     Heart sounds: Normal heart sounds.  Pulmonary:     Effort: Pulmonary effort is normal.     Breath sounds: Normal breath sounds.  Abdominal:     General: Bowel sounds are normal.     Palpations: Abdomen is soft.     Tenderness: There is no abdominal tenderness.  Musculoskeletal:        General: No swelling.     Right lower leg: No edema.     Left lower leg: No edema.  Lymphadenopathy:     Cervical: No cervical adenopathy.  Skin:    General: Skin is warm and dry.  Neurological:     General: No focal deficit present.     Mental Status: He is alert and oriented to person, place, and time.  Psychiatric:        Mood and Affect: Mood normal.        Behavior: Behavior normal.      LABORATORY DATA:  I have reviewed the labs as listed.  CBC     Component Value Date/Time   WBC 17.0 (H) 09/16/2020 1452   RBC 7.64 (H) 09/16/2020 1452   HGB 14.1 09/16/2020 1452   HCT 51.0 09/16/2020 1452   PLT 733 (H) 09/16/2020 1452   MCV 66.8 (L) 09/16/2020 1452   MCH 18.5 (L) 09/16/2020 1452   MCHC 27.6 (L) 09/16/2020 1452   RDW 26.0 (H) 09/16/2020 1452   LYMPHSABS 3.6 09/16/2020 1452   MONOABS 0.5 09/16/2020 1452   EOSABS 0.3 09/16/2020 1452   BASOSABS 1.4 (H) 09/16/2020 1452   CMP Latest Ref Rng & Units 09/16/2020 07/27/2020  Glucose 70 - 99 mg/dL 103(H) 88  BUN 6 - 20 mg/dL 20 18  Creatinine 0.61 - 1.24 mg/dL 1.16 1.04  Sodium 135 - 145 mmol/L 135 138  Potassium 3.5 - 5.1 mmol/L 4.0 4.7  Chloride 98 - 111 mmol/L 104 105  CO2 22 - 32 mmol/L 24 23  Calcium 8.9 - 10.3 mg/dL 8.7(L) 9.1  Total Protein 6.5 - 8.1 g/dL 6.7 6.9  Total Bilirubin 0.3 - 1.2 mg/dL 0.7 0.7  Alkaline Phos 38 - 126 U/L 73 67  AST 15 - 41 U/L 23 24  ALT 0 - 44 U/L 28 27    DIAGNOSTIC IMAGING:  I have independently reviewed the relevant imaging and discussed with the patient.  ASSESSMENT: 1.  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  - Patient was briefly on Hydroxyurea for less than 2 months in 2018, has been on long-term Aspirin since diagnosis. - Patient had multiple TIAs in September 2021, unclear if this is due to severe anemia due to concurrent GI bleeding, vertebral artery stenosis, or  thrombosis related to essential thrombocytosis; no other known thromboembolic events - History of vasomotor symptoms include burning sensation  of bilateral plantar feet (while he was off of aspirin for GI bleeding), resolved after restarting aspirin - Repeat bone marrow biopsy (08/16/2020): Markedly hypercellular bone marrow with pan myelo proliferation, including increased number of megakaryocytes with abnormal morphology; associated with mild reticulin fibrosis consistent with polycythemia vera or early primary myelofibrosis; definitive significant collagenous fibrosis is not seen; no increase in blast cells identified; abnormal plasma cells as further discussed below  FISH analysis (plasma cell myeloma prognostic panel) showed CCND1/IgH t(11;14)  Cytogenetics revealed normal male karyotype - CBC (09/16/2020) shows increase in all three cell lines with WBC 17.0 (ANC 11.2), platelets 733, and RBC 7.64 (Hgb 14.1/MCV 66.8)  2.  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) - Bone marrow biopsy (08/16/2020): Plasma cells are increased, representing 5% of all cells in bone marrow aspirate and representing 10% by CD138 immunohistochemistry, showing kappa light chain restriction consistent with plasma cell neoplasm  FISH analysis (plasma cell myeloma prognostic panel) showed CCND1/IgH t(11;14)  Cytogenetics revealed normal male karyotype - Free light chains (07/27/2020) shows markedly elevated kappa light chains 354, normal lambda light chain 6.0, elevated ratio 59.0 (SPEP and immunofixation normal) - Asymptomatic apart from general fatigue (energy 75%) - Most recent labs (09/16/2020) show Hgb 14.1, creatinine 1.16, and calcium 8.7  3.  Iron deficiency anemia secondary to GI blood loss - Patient had severe GI bleed in September 2021 (duodenal ulcer secondary to NSAID use) with hemoglobin < 6.0, required RBC transfusion x2 - Hgb remained low 6 months after GI bleed (07/27/2020) with Hgb 10.7, MCV 62.0 - Severe iron deficiency was noted (07/27/2020) with ferritin 3, iron saturation 4%, serum iron 21 - Received IV Venofer 1200 mg  in 4 divided doses (April 2022) - Denies current signs or symptoms of blood loss - Repeat CBC (09/16/2020) shows improved Hgb 14.1 - Repeat iron panel (09/16/2020) with ferritin 9, iron saturation 6%, serum iron 28  4.  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  Polycythemia vera (JAK2 V617F) - Check abdominal ultrasound due to complaints of early satiety and concern for splenomegaly  - Continue 81 mg aspirin (instructed to stop taking 325 mg aspirin) - RTC for follow-up in 4-weeks  2.  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  Iron deficiency anemia secondary to GI blood loss - Patient had severe GI bleed in September 2021 (duodenal ulcer secondary to NSAID use) with hemoglobin < 6.0, required RBC transfusion x2 - Denies current signs or symptoms of blood loss - Repeat CBC (09/16/2020) shows improved Hgb 14.1 - Repeat iron panel shows persistent iron deficiency - will transfuse IV Venofer 400 mg x 2 due to persistent fatigue in the setting of iron deficiency  4.  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 The patient knows to call the clinic with any problems, questions or concerns.  Medical  decision making: Moderate  Time spent on visit: I spent 30 minutes counseling the patient face to face. The total time spent in the appointment was 55 minutes and more than 50% was on counseling.  I, Tarri Abernethy PA-C, have seen this patient in conjunction with Dr. Derek Jack. Greater than 50% of visit was performed by Dr. Delton Coombes.  Addendum: I have independently performed face-to-face evaluation and have formulated my assessment and plan.  I agree with HPI written by David Burkitt, PA-C.  We have talked at length about his new diagnosis of plasma cell disorder.  We will obtain MRI of the spine and skeletal survey.  We will also obtain a baseline ultrasound for spleen size measurement.  We will also obtain 24-hour urine for total protein, UPEP and free light chains.  Derek Jack, MD  09/20/20 4:38 PM

## 2020-09-16 NOTE — Patient Instructions (Signed)
[place] at [facility] Discharge Instructions  You were seen today by Dr. Delton Coombes and Tarri Abernethy PA-C for your blood disorders.    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    Your iron deficiency anemia is related to your recent GI bleeding.  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  We need to run more tests to determine where you fall along this diagnostic spectrum.  LABS: Labs today, including 24-hour urine study.   OTHER TESTS:  - MRI spine - Whole-body X-ray - Abdominal ultrasound  MEDICATIONS: Take 81 mg aspirin (NOT 325 mg aspirin)  FOLLOW-UP APPOINTMENT: Return in 4 weeks (after MRI) to discuss results.   Thank you for choosing [place] at [facility] to provide your oncology and hematology care.  To afford each patient quality time with our provider, please arrive at least 15 minutes before your scheduled appointment time.   If you have a lab appointment with the [facility] please come in thru the Main Entrance and check in at the main information desk.  You need to re-schedule your appointment should you arrive 10 or more minutes late.  We strive to give you quality time with our providers, and arriving late affects you and other patients whose appointments are after yours.  Also, if you no show three or more times for appointments you may be dismissed from the clinic at the providers discretion.     Again, thank you for choosing [facility].  Our hope is that these requests will decrease the amount of time that you wait before being seen by our physicians.       _____________________________________________________________  Should you have questions after your visit to  [facility], please contact our office at [phone] and follow the prompts.  Our office hours are 8:00 a.m. and 4:30 p.m. Monday - Friday.  Please note that voicemails left after 4:00 p.m. may not be returned until the following business day.  We are closed weekends and major holidays.  You do have access to a nurse 24-7, just call the main number to the clinic [phone] and do not press any options, hold on the line and a nurse will answer the phone.    For prescription refill requests, have your pharmacy contact our office and allow 72 hours.    Due to Covid, you will need to wear a mask upon entering the hospital. If you do not have a mask, a mask will be given to you at the Main Entrance upon arrival. For doctor visits, patients may have 1 support person age 54 or older with them. For treatment visits, patients can not have anyone with them due to social distancing guidelines and our immunocompromised population.     Polycythemia Vera  Polycythemia vera (PV), or myeloproliferative disease, is a form of blood cancer in which the bone marrow makes too many (overproduces) red blood cells. The bone marrow may also make too many clotting cells (platelets) and white blood cells. Bone marrow is the spongy center of bones where blood cells are produced. Sometimes, there may be an overproduction of blood cells in the liver and spleen, causing those organs to become enlarged. Additionally, people who have PV are at a higher  risk for stroke or heart attack because their blood may clot more easily. PV is a long-term (chronic)disease. What are the causes? Almost all people who have PV have an abnormal gene (genetic mutation) that causes changes in the way that the bone marrow makes blood cells. This gene, which is called JAK2, is not passed along from parent to child (is not hereditary). It is not known what triggers the genetic mutation that causes the body to produce too many red blood  cells. What increases the risk? You are more likely to develop this condition if you are:  Male.  70 years of age or older. What are the signs or symptoms? You may not have any symptoms in the early stage of PV. When symptoms develop, they may include:  Shortness of breath.  Dizziness.  Hot and flushed skin.  Itchy skin.  Sweats, especially night sweats.  Headache.  Tiredness.  Ringing in the ears.  Blurred vision or blind spots.  Bone pain.  Weight loss.  Fever.  Blood-tinged vomit or stool. How is this diagnosed? This condition may be diagnosed during a routine physical exam and a blood test called a complete blood count (CBC). Your health care provider also may suspect PV if you have symptoms. During the physical exam, your provider may find that you have an enlarged liver or spleen. You may also have tests to confirm the diagnosis. These may include:  A procedure to remove a sample of bone marrow for testing (bone marrow biopsy).  Blood tests to check for: ? The JAK2 gene. ? Low levels of a hormone that helps to regulate blood production (erythropoietin). How is this treated? There is no cure for PV, but treatment can help to control the disease. There are several types of treatment. No single treatment works for everyone. You will need to work with a blood cancer specialist (hematologist) to find the treatment that is best for you. This condition may be treated by:  Periodically having some blood removed with a needle (drawn) to lower the number of red blood cells (phlebotomy).  Taking medicine. Your health care provider may recommend: ? Low-dose aspirin to lower your risk for blood clots. ? A medicine to reduce red blood cell production. ? A medicine to lower the number of red blood cells. ? A medicine that slows down the effects of JAK2 gene. ? Other medicines to treat symptoms such as itching. Follow these instructions at home:  Take over-the-counter  and prescription medicines only as told by your health care provider.  Return to your normal activities as told by your health care provider. Ask your health care provider what activities are safe for you.  Do regular exercise as told by your health care provider.  Check your hands and feet regularly for any sores that do not heal.  Do not use any products that contain nicotine or tobacco, such as cigarettes, e-cigarettes, and chewing tobacco. If you need help quitting, ask your health care provider.  Keep all follow-up visits as told by your health care provider. This is important.   Contact a health care provider if:  You have side effects from your medicines.  Your symptoms change or get worse at home.  You have blood in your stool or you vomit blood. Get help right away if:  You have sudden and severe pain in your abdomen.  You have chest pain or difficulty breathing.  You have signs of stroke, such as: ? Sudden numbness. ? Weakness  of your face or arm. ? Confusion. ? Difficulty speaking or understanding speech. These symptoms may represent a serious problem that is an emergency. Do not wait to see if the symptoms will go away. Get medical help right away. Call your local emergency services (911 in the [place] Do not drive yourself to the hospital. Summary  Polycythemia vera is a form of blood cancer in which the bone marrow makes too many red blood cells.  People who have polycythemia vera are at a higher risk for stroke or heart attack because their blood may clot more easily.  The disease is caused by a genetic mutation that causes changes in the way that the bone marrow makes blood cells.  It is diagnosed with blood tests and a bone marrow biopsy.  There is no cure for PV, but treatment can help to control the disease. It is treated by periodically having some blood removed and by taking medicines. This information is not intended to replace advice given to you by your  health care provider. Make sure you discuss any questions you have with your health care provider. Document Revised: 02/14/2018 Document Reviewed: 02/14/2018 Elsevier Patient Education  2021 [employer].  Monoclonal Gammopathy of Undetermined Significance Monoclonal gammopathy of undetermined significance (MGUS) is a condition in which there is too much of a protein called monoclonal protein, or M protein, in the blood. MGUS can cause you to have too many cells in your blood and not enough space for healthy cells. This condition does not cause symptoms, but it may increase your risk of developing multiple myeloma or other blood disorders in the future. What are the causes? The cause of this condition is not known. Genetics and the environment may play a role. What increases the risk? You are more likely to develop this condition if:  You are African American.  You are age 8 or older.  You are male.  You have an autoimmune disease.  You have been exposed to radiation.  You have a family history of MGUS. What are the signs or symptoms? There are no symptoms of this condition. How is this diagnosed? This condition may be diagnosed with a blood test that checks for M protein.   How is this treated? Treatment may involve monitoring your condition. This may include:  Having regular exams. This will allow your health care provider to monitor your health.  Having tests done regularly, such as: ? Blood tests to check for M protein in your body. ? Imaging tests, such as a CT scan. ? A bone marrow biopsy. This test involves taking a sample of bone marrow from your body so it can be looked at under a microscope. Follow these instructions at home:  Keep all follow-up visits as told by your health care provider. This is important. Contact a health care provider if:  You have trouble swallowing.  You have pain in your back or ribs.  You have a fever.  You are bruising easily. Get  help right away if:  You break a bone.  You have trouble breathing. Summary  Monoclonal gammopathy of undetermined significance (MGUS) is a condition in which there is too much of a protein called monoclonal protein, or M protein, in the blood.  This condition may be diagnosed with a blood test that checks for M protein.  Treatment for this condition may involve having tests done regularly. Tests may include blood tests, imaging tests, and a bone marrow biopsy. This information is not  intended to replace advice given to you by your health care provider. Make sure you discuss any questions you have with your health care provider. Document Revised: 02/27/2019 Document Reviewed: 02/27/2019 Elsevier Patient Education  2021 [employer].

## 2020-09-17 LAB — KAPPA/LAMBDA LIGHT CHAINS
Kappa free light chain: 327 mg/L — ABNORMAL HIGH (ref 3.3–19.4)
Kappa, lambda light chain ratio: 72.67 — ABNORMAL HIGH (ref 0.26–1.65)
Lambda free light chains: 4.5 mg/L — ABNORMAL LOW (ref 5.7–26.3)

## 2020-09-20 ENCOUNTER — Encounter (HOSPITAL_COMMUNITY): Payer: Self-pay | Admitting: Hematology

## 2020-09-22 LAB — SURGICAL PATHOLOGY

## 2020-09-27 ENCOUNTER — Encounter (HOSPITAL_COMMUNITY): Payer: Self-pay

## 2020-09-27 ENCOUNTER — Other Ambulatory Visit: Payer: Self-pay

## 2020-09-27 ENCOUNTER — Inpatient Hospital Stay (HOSPITAL_COMMUNITY): Payer: 59 | Attending: Hematology

## 2020-09-27 VITALS — BP 133/83 | HR 51 | Temp 96.8°F | Resp 17

## 2020-09-27 DIAGNOSIS — M47812 Spondylosis without myelopathy or radiculopathy, cervical region: Secondary | ICD-10-CM | POA: Diagnosis not present

## 2020-09-27 DIAGNOSIS — C9000 Multiple myeloma not having achieved remission: Secondary | ICD-10-CM | POA: Diagnosis not present

## 2020-09-27 DIAGNOSIS — M47814 Spondylosis without myelopathy or radiculopathy, thoracic region: Secondary | ICD-10-CM | POA: Diagnosis not present

## 2020-09-27 DIAGNOSIS — R600 Localized edema: Secondary | ICD-10-CM | POA: Diagnosis not present

## 2020-09-27 DIAGNOSIS — M4312 Spondylolisthesis, cervical region: Secondary | ICD-10-CM | POA: Insufficient documentation

## 2020-09-27 DIAGNOSIS — Z87891 Personal history of nicotine dependence: Secondary | ICD-10-CM | POA: Diagnosis not present

## 2020-09-27 DIAGNOSIS — Z881 Allergy status to other antibiotic agents status: Secondary | ICD-10-CM | POA: Diagnosis not present

## 2020-09-27 DIAGNOSIS — M47816 Spondylosis without myelopathy or radiculopathy, lumbar region: Secondary | ICD-10-CM | POA: Insufficient documentation

## 2020-09-27 DIAGNOSIS — M5136 Other intervertebral disc degeneration, lumbar region: Secondary | ICD-10-CM | POA: Insufficient documentation

## 2020-09-27 DIAGNOSIS — R161 Splenomegaly, not elsewhere classified: Secondary | ICD-10-CM | POA: Diagnosis not present

## 2020-09-27 DIAGNOSIS — M48061 Spinal stenosis, lumbar region without neurogenic claudication: Secondary | ICD-10-CM | POA: Diagnosis not present

## 2020-09-27 DIAGNOSIS — D509 Iron deficiency anemia, unspecified: Secondary | ICD-10-CM | POA: Insufficient documentation

## 2020-09-27 DIAGNOSIS — D500 Iron deficiency anemia secondary to blood loss (chronic): Secondary | ICD-10-CM

## 2020-09-27 DIAGNOSIS — Z79899 Other long term (current) drug therapy: Secondary | ICD-10-CM | POA: Insufficient documentation

## 2020-09-27 DIAGNOSIS — D75839 Thrombocytosis, unspecified: Secondary | ICD-10-CM | POA: Diagnosis not present

## 2020-09-27 DIAGNOSIS — Z880 Allergy status to penicillin: Secondary | ICD-10-CM | POA: Diagnosis not present

## 2020-09-27 DIAGNOSIS — M2578 Osteophyte, vertebrae: Secondary | ICD-10-CM | POA: Diagnosis not present

## 2020-09-27 DIAGNOSIS — Z823 Family history of stroke: Secondary | ICD-10-CM | POA: Diagnosis not present

## 2020-09-27 DIAGNOSIS — M50322 Other cervical disc degeneration at C5-C6 level: Secondary | ICD-10-CM | POA: Insufficient documentation

## 2020-09-27 DIAGNOSIS — Z8249 Family history of ischemic heart disease and other diseases of the circulatory system: Secondary | ICD-10-CM | POA: Insufficient documentation

## 2020-09-27 DIAGNOSIS — D45 Polycythemia vera: Secondary | ICD-10-CM | POA: Insufficient documentation

## 2020-09-27 MED ORDER — LORATADINE 10 MG PO TABS
10.0000 mg | ORAL_TABLET | Freq: Once | ORAL | Status: AC
  Administered 2020-09-27: 10 mg via ORAL

## 2020-09-27 MED ORDER — ACETAMINOPHEN 325 MG PO TABS
650.0000 mg | ORAL_TABLET | Freq: Once | ORAL | Status: AC
  Administered 2020-09-27: 650 mg via ORAL

## 2020-09-27 MED ORDER — SODIUM CHLORIDE 0.9 % IV SOLN
Freq: Once | INTRAVENOUS | Status: AC
Start: 2020-09-27 — End: 2020-09-27

## 2020-09-27 MED ORDER — LORATADINE 10 MG PO TABS
ORAL_TABLET | ORAL | Status: AC
  Filled 2020-09-27: qty 1

## 2020-09-27 MED ORDER — ACETAMINOPHEN 325 MG PO TABS
ORAL_TABLET | ORAL | Status: AC
  Filled 2020-09-27: qty 2

## 2020-09-27 MED ORDER — SODIUM CHLORIDE 0.9 % IV SOLN
400.0000 mg | Freq: Once | INTRAVENOUS | Status: AC
Start: 2020-09-27 — End: 2020-09-27
  Administered 2020-09-27: 400 mg via INTRAVENOUS
  Filled 2020-09-27: qty 20

## 2020-09-27 NOTE — Patient Instructions (Signed)
[facility]  Discharge Instructions: Thank you for choosing [facility] to provide your oncology and hematology care.  If you have a lab appointment with the [facility], please come in thru the Main Entrance and check in at the main information desk.  Wear comfortable clothing and clothing appropriate for easy access to any Portacath or PICC line.   We strive to give you quality time with your provider. You may need to reschedule your appointment if you arrive late (15 or more minutes).  Arriving late affects you and other patients whose appointments are after yours.  Also, if you miss three or more appointments without notifying the office, you may be dismissed from the clinic at the provider's discretion.      For prescription refill requests, have your pharmacy contact our office and allow 72 hours for refills to be completed.    Venofer 400 mg today.  Return as scheduled.  Please call the clinic if you have any questions or concerns.      To help prevent nausea and vomiting after your treatment, we encourage you to take your nausea medication as directed.  BELOW ARE SYMPTOMS THAT SHOULD BE REPORTED IMMEDIATELY: . *FEVER GREATER THAN 100.4 F (38 C) OR HIGHER . *CHILLS OR SWEATING . *NAUSEA AND VOMITING THAT IS NOT CONTROLLED WITH YOUR NAUSEA MEDICATION . *UNUSUAL SHORTNESS OF BREATH . *UNUSUAL BRUISING OR BLEEDING . *URINARY PROBLEMS (pain or burning when urinating, or frequent urination) . *BOWEL PROBLEMS (unusual diarrhea, constipation, pain near the anus) . TENDERNESS IN MOUTH AND THROAT WITH OR WITHOUT PRESENCE OF ULCERS (sore throat, sores in mouth, or a toothache) . UNUSUAL RASH, SWELLING OR PAIN  . UNUSUAL VAGINAL DISCHARGE OR ITCHING   Items with * indicate a potential emergency and should be followed up as soon as possible or go to the Emergency Department if any problems should occur.  Please show the CHEMOTHERAPY ALERT CARD or IMMUNOTHERAPY  ALERT CARD at check-in to the Emergency Department and triage nurse.  Should you have questions after your visit or need to cancel or reschedule your appointment, please contact [facility] [phone]  and follow the prompts.  Office hours are 8:00 a.m. to 4:30 p.m. Monday - Friday. Please note that voicemails left after 4:00 p.m. may not be returned until the following business day.  We are closed weekends and major holidays. You have access to a nurse at all times for urgent questions. Please call the main number to the clinic [phone] and follow the prompts.  For any non-urgent questions, you may also contact your provider using MyChart. We now offer e-Visits for anyone 35 and older to request care online for non-urgent symptoms. For details visit [email or web address].   Also download the MyChart app! Go to the app store, search "MyChart", open the app, select [facility], and log in with your MyChart username and password.  Due to Covid, a mask is required upon entering the hospital/clinic. If you do not have a mask, one will be given to you upon arrival. For doctor visits, patients may have 1 support person aged 75 or older with them. For treatment visits, patients cannot have anyone with them due to current Covid guidelines and our immunocompromised population.

## 2020-09-27 NOTE — Progress Notes (Signed)
Pt here for Venofer 400 mg.  Pre-medications given.   Pt tolerated treatment well today without incidence.  Discharged in stable condition ambulatory.  Stable during and after treatment.  AVS reviewed.  Vital signs stable prior to discharge.

## 2020-10-05 ENCOUNTER — Ambulatory Visit (HOSPITAL_COMMUNITY)
Admission: RE | Admit: 2020-10-05 | Discharge: 2020-10-05 | Disposition: A | Discharge status: Home / Self Care | Payer: 59 | Source: Ambulatory Visit | Attending: Physician Assistant | Admitting: Physician Assistant

## 2020-10-05 ENCOUNTER — Telehealth (HOSPITAL_COMMUNITY): Payer: Self-pay | Admitting: *Deleted

## 2020-10-05 DIAGNOSIS — D472 Monoclonal gammopathy: Secondary | ICD-10-CM | POA: Insufficient documentation

## 2020-10-05 DIAGNOSIS — D45 Polycythemia vera: Secondary | ICD-10-CM | POA: Insufficient documentation

## 2020-10-05 MED ORDER — GADOBUTROL 1 MMOL/ML IV SOLN
10.0000 mL | Freq: Once | INTRAVENOUS | Status: AC | PRN
  Administered 2020-10-05: 10 mL via INTRAVENOUS

## 2020-10-05 NOTE — Telephone Encounter (Signed)
[facility] called results report of MRI for this patient. Results printed and given to Dr. Delton Coombes.

## 2020-10-06 ENCOUNTER — Ambulatory Visit (HOSPITAL_COMMUNITY)
Admission: RE | Admit: 2020-10-06 | Discharge: 2020-10-06 | Disposition: A | Discharge status: Home / Self Care | Payer: 59 | Source: Ambulatory Visit | Attending: Physician Assistant | Admitting: Physician Assistant

## 2020-10-06 ENCOUNTER — Other Ambulatory Visit (HOSPITAL_COMMUNITY): Payer: Self-pay | Admitting: Physician Assistant

## 2020-10-06 ENCOUNTER — Other Ambulatory Visit: Payer: Self-pay

## 2020-10-06 ENCOUNTER — Inpatient Hospital Stay (HOSPITAL_COMMUNITY): Payer: 59

## 2020-10-06 VITALS — BP 130/78 | HR 50 | Temp 97.0°F | Resp 18

## 2020-10-06 DIAGNOSIS — D500 Iron deficiency anemia secondary to blood loss (chronic): Secondary | ICD-10-CM

## 2020-10-06 DIAGNOSIS — D472 Monoclonal gammopathy: Secondary | ICD-10-CM | POA: Diagnosis not present

## 2020-10-06 DIAGNOSIS — D509 Iron deficiency anemia, unspecified: Secondary | ICD-10-CM

## 2020-10-06 DIAGNOSIS — R1900 Intra-abdominal and pelvic swelling, mass and lump, unspecified site: Secondary | ICD-10-CM

## 2020-10-06 DIAGNOSIS — R161 Splenomegaly, not elsewhere classified: Secondary | ICD-10-CM

## 2020-10-06 MED ORDER — SODIUM CHLORIDE 0.9 % IV SOLN
Freq: Once | INTRAVENOUS | Status: AC
Start: 2020-10-06 — End: 2020-10-06

## 2020-10-06 MED ORDER — ACETAMINOPHEN 325 MG PO TABS
650.0000 mg | ORAL_TABLET | Freq: Once | ORAL | Status: AC
  Administered 2020-10-06: 650 mg via ORAL
  Filled 2020-10-06: qty 2

## 2020-10-06 MED ORDER — LORATADINE 10 MG PO TABS
10.0000 mg | ORAL_TABLET | Freq: Once | ORAL | Status: AC
Start: 2020-10-06 — End: 2020-10-06
  Administered 2020-10-06: 10 mg via ORAL
  Filled 2020-10-06: qty 1

## 2020-10-06 MED ORDER — SODIUM CHLORIDE 0.9 % IV SOLN
400.0000 mg | Freq: Once | INTRAVENOUS | Status: AC
  Administered 2020-10-06: 400 mg via INTRAVENOUS
  Filled 2020-10-06: qty 20

## 2020-10-06 NOTE — Progress Notes (Signed)
Patient presents today for 400 mg Venofer infusion per MD order.  Vital signs WNL.  Patient has no new complaints since last visit.  Peripheral IV started and blood return noted pre and post infusion.  Venofer infusion given today per MD orders.  Stable during infusion without adverse affects.  Patient states that he can not stay the full thirty minute post infusion wait time, that the has an appointment with radiology.   Vital signs stable.  No complaints at this time.  Discharge from clinic ambulatory in stable condition.  Alert and oriented X 3.  Follow up with [facility] as scheduled.

## 2020-10-06 NOTE — Patient Instructions (Signed)
[facility] [place]  Discharge Instructions: Thank you for choosing [facility] to provide your oncology and hematology care.  If you have a lab appointment with the [facility], please come in thru the Main Entrance and check in at the main information desk.  Wear comfortable clothing and clothing appropriate for easy access to any Portacath or PICC line.   We strive to give you quality time with your provider. You may need to reschedule your appointment if you arrive late (15 or more minutes).  Arriving late affects you and other patients whose appointments are after yours.  Also, if you miss three or more appointments without notifying the office, you may be dismissed from the clinic at the provider's discretion.      For prescription refill requests, have your pharmacy contact our office and allow 72 hours for refills to be completed.    Today you received the following chemotherapy and/or immunotherapy agents Venofer infusion      To help prevent nausea and vomiting after your treatment, we encourage you to take your nausea medication as directed.  BELOW ARE SYMPTOMS THAT SHOULD BE REPORTED IMMEDIATELY: *FEVER GREATER THAN 100.4 F (38 C) OR HIGHER *CHILLS OR SWEATING *NAUSEA AND VOMITING THAT IS NOT CONTROLLED WITH YOUR NAUSEA MEDICATION *UNUSUAL SHORTNESS OF BREATH *UNUSUAL BRUISING OR BLEEDING *URINARY PROBLEMS (pain or burning when urinating, or frequent urination) *BOWEL PROBLEMS (unusual diarrhea, constipation, pain near the anus) TENDERNESS IN MOUTH AND THROAT WITH OR WITHOUT PRESENCE OF ULCERS (sore throat, sores in mouth, or a toothache) UNUSUAL RASH, SWELLING OR PAIN  UNUSUAL VAGINAL DISCHARGE OR ITCHING   Items with * indicate a potential emergency and should be followed up as soon as possible or go to the Emergency Department if any problems should occur.  Please show the CHEMOTHERAPY ALERT CARD or IMMUNOTHERAPY ALERT CARD at check-in to the Emergency  Department and triage nurse.  Should you have questions after your visit or need to cancel or reschedule your appointment, please contact [facility] [phone]  and follow the prompts.  Office hours are 8:00 a.m. to 4:30 p.m. Monday - Friday. Please note that voicemails left after 4:00 p.m. may not be returned until the following business day.  We are closed weekends and major holidays. You have access to a nurse at all times for urgent questions. Please call the main number to the clinic [phone] and follow the prompts.  For any non-urgent questions, you may also contact your provider using MyChart. We now offer e-Visits for anyone 42 and older to request care online for non-urgent symptoms. For details visit [email or web address].   Also download the MyChart app! Go to the app store, search "MyChart", open the app, select [facility], and log in with your MyChart username and password.  Due to Covid, a mask is required upon entering the hospital/clinic. If you do not have a mask, one will be given to you upon arrival. For doctor visits, patients may have 1 support person aged 2 or older with them. For treatment visits, patients cannot have anyone with them due to current Covid guidelines and our immunocompromised population.

## 2020-10-11 ENCOUNTER — Other Ambulatory Visit (HOSPITAL_COMMUNITY): Payer: Self-pay

## 2020-10-11 DIAGNOSIS — D472 Monoclonal gammopathy: Secondary | ICD-10-CM

## 2020-10-11 DIAGNOSIS — C9000 Multiple myeloma not having achieved remission: Secondary | ICD-10-CM | POA: Diagnosis not present

## 2020-10-12 ENCOUNTER — Ambulatory Visit (HOSPITAL_COMMUNITY): Payer: 59 | Admitting: Hematology

## 2020-10-13 ENCOUNTER — Inpatient Hospital Stay (HOSPITAL_BASED_OUTPATIENT_CLINIC_OR_DEPARTMENT_OTHER): Payer: 59 | Admitting: Hematology and Oncology

## 2020-10-13 ENCOUNTER — Encounter (HOSPITAL_COMMUNITY): Payer: Self-pay | Admitting: Hematology and Oncology

## 2020-10-13 ENCOUNTER — Other Ambulatory Visit: Payer: Self-pay

## 2020-10-13 DIAGNOSIS — C9000 Multiple myeloma not having achieved remission: Secondary | ICD-10-CM

## 2020-10-13 DIAGNOSIS — D471 Chronic myeloproliferative disease: Secondary | ICD-10-CM

## 2020-10-13 DIAGNOSIS — D7389 Other diseases of spleen: Secondary | ICD-10-CM

## 2020-10-13 LAB — UPEP/UIFE/LIGHT CHAINS/TP, 24-HR UR
% BETA, Urine: 6.8 %
ALPHA 1 URINE: 3.4 %
Albumin, U: 51.4 %
Alpha 2, Urine: 4.1 %
Free Kappa Lt Chains,Ur: 585.8 mg/L — ABNORMAL HIGH (ref 1.17–86.46)
Free Kappa/Lambda Ratio: 214.58 — ABNORMAL HIGH (ref 1.83–14.26)
Free Lambda Lt Chains,Ur: 2.73 mg/L (ref 0.27–15.21)
GAMMA GLOBULIN URINE: 34.2 %
M-SPIKE %, Urine: 27.3 % — ABNORMAL HIGH
M-Spike, Mg/24 Hr: 117 mg/24 hr — ABNORMAL HIGH
Total Protein, Urine-Ur/day: 429 mg/24 hr — ABNORMAL HIGH (ref 30–150)
Total Protein, Urine: 21.7 mg/dL
Total Volume: 1975

## 2020-10-13 NOTE — Assessment & Plan Note (Signed)
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

## 2020-10-13 NOTE — Assessment & Plan Note (Signed)
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

## 2020-10-13 NOTE — Assessment & Plan Note (Signed)
We will evaluate further with MRI next week

## 2020-10-13 NOTE — Progress Notes (Signed)
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 male is seen today because his primary oncologist is not available This patient has been followed closely for diagnosis of polycythemia vera, smoldering myeloma and severe iron deficiency He had received intravenous iron infusion recently with improved symptoms He had imaging study performed recently Ultrasound was done last week which showed splenomegaly and also indeterminate lesion in the spleen MRI of the skeletal spine showed diffuse abnormalities along with spinal stenosis His serum free light chain levels were elevated; confirm on 24-hour urine collection He returns to review test results He is bothered by multiple subcutaneous skin lesions in multiple places  I reviewed the patient's records extensive and collaborated the history with the patient. Summary of his history is as follows: Oncology History  Multiple myeloma without remission ([place])  08/16/2020 Bone Marrow Biopsy   BONE MARROW, ASPIRATE, CLOT, CORE:  -Hypercellular bone marrow with myeloproliferative neoplasm  -Plasma cell neoplasm  -See comment   PERIPHERAL BLOOD:  -Microcytic-hypochromic anemia  -Leukocytosis  -Thrombocytosis   COMMENT:   The bone marrow is markedly hypercellular with pan myeloid proliferation including increased number of megakaryocytes with abnormal morphology. No increase in blastic cells identified.  This is associated with mild reticulin fibrosis.  Given the previously documented positivity for JAK2 mutation in peripheral blood, the overall myeloid changes are consistent with a myeloproliferative neoplasm.  In this setting, the main considerations include iron-deficient polycythemia vera, early primary myelofibrosis.  In  addition, the plasma cells are increased representing 5% of all cells in the aspirate associated with numerous predominantly small clusters in the clot and biopsy sections.  The plasma cells represent an estimated 10% by CD138 immunohistochemistry and show kappa light chain restriction consistent with plasma cell neoplasm. Correlation with cytogenetic and FISH studies is recommended.   10/05/2020 Imaging   US abdomen 1. Splenomegaly with splenic volume of 1210.2 cm 2. 1.6 cm indeterminate lesion within the inferior spleen. Consider further evaluation with MRI   10/05/2020 Imaging   MR total spine 1. Diffuse abnormal T1 hypointense marrow signal throughout the cervical, thoracic, lumbar and visualized sacral spine compatible with the given history. There are subcentimeter lesions within the T10 and T11 vertebral bodies which demonstrate precontrast T1 hyperintensity and are favored to reflect hemangiomas. A 3 mm enhancing lesion within the L3 vertebral body has no convincing precontrast T1 hyperintensity. This lesion may reflect a lipid-poor hemangioma or a small multiple myeloma lesion. Interval MRI follow-up is recommended to ensure stability. 2. At C5-C6, there is multifactorial severe spinal canal stenosis with at least mild spinal cord flattening. Apparent T2 hyperintense signal abnormality within the right aspect of the spinal cord at this level, which may reflect myelomalacia or focal edema. Bilateral neural foraminal narrowing also present at this level (severe right, moderate left). 3. Thoracic spondylosis, as outlined. No more than mild relative spinal canal stenosis is appreciated. No compressive neural foraminal narrowing identified. 4. Lumbar spondylosis, as outlined. No more than mild central canal stenosis is appreciated. Neural foraminal narrowing bilaterally at L3-L4 (mild) and bilaterally at L4-L5 (mild right, mild/moderate left.   10/07/2020 Imaging   No focal lytic or blastic bone  lesions identified. Incidental findings as noted above.   10/13/2020 Initial Diagnosis   Multiple myeloma without remission ([place])  MEDICAL HISTORY:  Past Medical History:  Diagnosis Date   Anemia    Blood transfusion without reported diagnosis    Diverticulitis    GERD (gastroesophageal reflux disease)    GI bleed    Hypertension    PONV (postoperative nausea and vomiting)    with colonoscopy    SURGICAL HISTORY: Past Surgical History:  Procedure Laterality Date   ANKLE FRACTURE SURGERY Left    COLONOSCOPY N/A 01/30/2017   Procedure: COLONOSCOPY;  Surgeon: Danie Binder, MD;  Location: [facility];  Service: Endoscopy;  Laterality: N/A;  [place] / REPAIR  2014   LIPOMA EXCISION     Neck   POLYPECTOMY  01/30/2017   Procedure: POLYPECTOMY;  Surgeon: Danie Binder, MD;  Location: [facility];  Service: Endoscopy;;  Ascending colon   VASECTOMY  2005    SOCIAL HISTORY: Social History   Socioeconomic History   Marital status: Married    Spouse name: Not on file   Number of children: 4   Years of education: Not on file   Highest education level: Not on file  Occupational History   Occupation: Retired  Tobacco Use   Smoking status: Never   Smokeless tobacco: Former    Types: Chew    Quit date: 01/22/2020  Vaping Use   Vaping Use: Never used  Substance and Sexual Activity   Alcohol use: Yes    Comment: maybe 1 beer a night   Drug use: No   Sexual activity: Yes  Other Topics Concern   Not on file  Social History Narrative   Not on file   Social Determinants of Health   Financial Resource Strain: Low Risk    Difficulty of Paying Living Expenses: Not hard at all  Food Insecurity: No Food Insecurity   Worried About Charity fundraiser in the Last Year: Never true   [place] in the Last Year: Never true  Transportation Needs: No Transportation Needs   Lack of Transportation (Medical): No   Lack of Transportation (Non-Medical):  No  Physical Activity: Insufficiently Active   Days of Exercise per Week: 2 days   Minutes of Exercise per Session: 30 min  Stress: No Stress Concern Present   Feeling of Stress : Not at all  Social Connections: Moderately Isolated   Frequency of Communication with Friends and Family: More than three times a week   Frequency of Social Gatherings with Friends and Family: Three times a week   Attends Religious Services: Never   Active Member of Clubs or Organizations: No   Attends Music therapist: Never   Marital Status: Married  Human resources officer Violence: Not At Risk   Fear of Current or Ex-Partner: No   Emotionally Abused: No   Physically Abused: No   Sexually Abused: No    FAMILY HISTORY: Family History  Problem Relation Age of Onset   Hypertension Mother    Hypertension Father    Stroke Paternal Grandmother    Heart disease Paternal Grandfather     ALLERGIES:  is allergic to hydralazine, losartan, metronidazole, and piperacillin sod-tazobactam so.  MEDICATIONS:  Current Outpatient Medications  Medication Sig Dispense Refill   ALPRAZolam (XANAX) 0.25 MG tablet Take 1 tablet by mouth as needed.     amLODipine (NORVASC) 10 MG tablet Take 10 mg by mouth daily.     aspirin 325 MG tablet Take 325 mg by mouth daily.     clopidogrel (PLAVIX)  75 MG tablet Take 1 tablet by mouth daily.     guanFACINE (INTUNIV) 1 MG TB24 ER tablet Take 1 tablet by mouth daily.     Nebivolol HCl 20 MG TABS Take 1 tablet by mouth daily.     NP THYROID 60 MG tablet Take 60 mg by mouth daily.     Omega-3 Fatty Acids (FISH OIL) 1000 MG CAPS Take by mouth.     pantoprazole (PROTONIX) 40 MG tablet Take 40 mg by mouth daily.     rosuvastatin (CRESTOR) 40 MG tablet Take 1 tablet by mouth daily.     sulfacetamide (BLEPH-10) 10 % ophthalmic solution SMARTSIG:1-2 Drop(s) Right Eye Every 2 Hours PRN     tiZANidine (ZANAFLEX) 4 MG tablet Take 4 mg by mouth 3 (three) times daily.     Vitamin D,  Cholecalciferol, 25 MCG (1000 UT) TABS Take 1,000 Units by mouth daily.     No current facility-administered medications for this visit.    REVIEW OF SYSTEMS:   Constitutional: Denies fevers, chills or abnormal night sweats Eyes: Denies blurriness of vision, double vision or watery eyes Ears, nose, mouth, throat, and face: Denies mucositis or sore throat Respiratory: Denies cough, dyspnea or wheezes Cardiovascular: Denies palpitation, chest discomfort or lower extremity swelling Gastrointestinal:  Denies nausea, heartburn or change in bowel habits Lymphatics: Denies new lymphadenopathy or easy bruising Neurological:Denies numbness, tingling or new weaknesses Behavioral/Psych: Mood is stable, no new changes  All other systems were reviewed with the patient and are negative.  PHYSICAL EXAMINATION: ECOG PERFORMANCE STATUS: 1 - Symptomatic but completely ambulatory  Vitals:   10/13/20 1446  BP: (!) 141/84  Pulse: 71  Resp: 18  Temp: (!) 96.8 F (36 C)  SpO2: 96%   Filed Weights   10/13/20 1446  Weight: 205 lb 4.8 oz (93.1 kg)    GENERAL:alert, no distress and comfortable SKIN: Noted multiple subcutaneous skin lesions. NEURO: no focal motor/sensory deficits  LABORATORY DATA:  I have reviewed the data as listed Lab Results  Component Value Date   WBC 17.0 (H) 09/16/2020   HGB 14.1 09/16/2020   HCT 51.0 09/16/2020   MCV 66.8 (L) 09/16/2020   PLT 733 (H) 09/16/2020   Recent Labs    07/27/20 1454 09/16/20 1452  NA 138 135  K 4.7 4.0  CL 105 104  CO2 23 24  GLUCOSE 88 103*  BUN 18 20  CREATININE 1.04 1.16  CALCIUM 9.1 8.7*  GFRNONAA >60 >60  PROT 6.9 6.7  ALBUMIN 4.4 4.3  AST 24 23  ALT 27 28  ALKPHOS 67 73  BILITOT 0.7 0.7    RADIOGRAPHIC STUDIES: I have personally reviewed the radiological images as listed and agreed with the findings in the report. US Abdomen Limited  Result Date: 10/05/2020 CLINICAL DATA:  Splenomegaly EXAM: ULTRASOUND ABDOMEN  LIMITED COMPARISON:  None. FINDINGS: Limited ultrasound of the left upper quadrant is performed. The spleen measures 13.3 x 11.3 x 15.4 cm with a volume of 1210.2 cm. Indeterminate mass within the anterior inferior spleen with echogenic rim and hypoechoic center measuring 1.3 x 1.6 x 1.6 cm. IMPRESSION: 1. Splenomegaly with splenic volume of 1210.2 cm 2. 1.6 cm indeterminate lesion within the inferior spleen. Consider further evaluation with MRI. Electronically Signed   By: Donavan Foil M.D.   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 FINDINGS: No focal lytic or by blastic bone lesions are identified. Mild to moderate degenerative disc disease is noted within the cervical spine. Mild degenerative changes are noted throughout the thoracic spine. Minimal degenerative change noted within the lumbar spine. Vascular calcifications are seen within the aortoiliac vasculature. Dystrophic calcification noted within the right foreleg proximally possibly involving the proximal tibia fibular syndesmosis. Distal left fibular ORIF has been performed with dystrophic calcification noted involving the distal tibiofibular syndesmosis. IMPRESSION: No focal lytic or blastic bone lesions identified. Incidental findings as noted above. Electronically Signed   By: Fidela Salisbury MD   On: 10/07/2020 09:10   MR TOTAL SPINE METS SCREENING  Result Date: 10/05/2020 CLINICAL DATA:  Provided history: MGUS (monoclonal gammopathy of unknown significance). MGUS versus multiple myeloma, concern for bone lesions. EXAM: MRI TOTAL SPINE WITHOUT AND WITH CONTRAST TECHNIQUE: Multisequence MR imaging of the spine from the cervical spine to the sacrum was performed prior to and following IV contrast administration for evaluation of spinal metastatic disease. CONTRAST:  35m GADAVIST  GADOBUTROL 1 MMOL/ML IV SOLN COMPARISON:  No pertinent prior exams available for comparison. FINDINGS: MRI CERVICAL SPINE FINDINGS Alignment: Straightening of the expected cervical lordosis. Trace C5-C6 grade 1 retrolisthesis. Vertebrae: Diffuse abnormal T1 hypointense marrow signal consistent with the given history. No superimposed focal suspicious osseous lesion is identified. Cord: Apparent T2 hyperintense signal abnormality within the right aspect of the spinal cord at C5-C6, as described below. Posterior Fossa, vertebral arteries, paraspinal tissues: No abnormality identified within included portions of the posterior fossa. Flow voids preserved within the imaged cervical vertebral arteries. Paraspinal soft tissues within normal limits. Disc levels: Cervical spondylosis, most notably as follows. At C5-C6, there is moderate disc degeneration. Posterior disc osteophyte complex with bilateral disc osteophyte ridge/uncinate hypertrophy. Facet arthrosis (greater on the left). Ligamentum flavum hypertrophy. Severe spinal canal stenosis with at least mild spinal cord flattening. Apparent T2 hyperintense signal abnormality within the right aspect of the spinal cord, which may reflect myelomalacia or focal edema (series 44, image 5). Bilateral neural foraminal narrowing (moderate/severe right, moderate left). MRI THORACIC SPINE FINDINGS Alignment:  No significant spondylolisthesis. Vertebrae: Diffuse abnormal T1 hypointense marrow signal throughout the thoracic spine compatible with the given history. Vertebral body height is maintained. Multilevel degenerative endplate irregularity with small Schmorl nodes. 8 mm T2/STIR hyperintense lesion within the T10 vertebral body. 4 mm T2/STIR hyperintense lesion within the T11 vertebral body. There is precontrast T1 hyperintense signal centrally within both of these lesions, and these are favored to reflect hemangiomas. Trace degenerative edema and enhancement along the T12  superior endplate. No focal suspicious osseous lesion is identified. Cord: No appreciable signal abnormality or abnormal cord enhancement on the acquired sagittal imaging. Paraspinal and other soft tissues: Paraspinal soft tissues within normal limits. Disc levels: No more than mild disc space narrowing at any level. Shallow multilevel disc bulges. These disc bulges may slightly narrow the spinal canal, but there is no appreciable spinal cord mass effect. No appreciable compressive foraminal stenosis. MRI LUMBAR SPINE FINDINGS Segmentation: 5 lumbar vertebrae. The caudal most well-formed intervertebral disc space is designated L5-S1. Alignment:  No significant spondylolisthesis. Vertebrae: Diffuse abnormal T1 hypointense marrow signal throughout the lumbar and visualized sacral spine compatible with the given history. 3 mm T2/STIR hyperintense and enhancing lesion within the L3 vertebral body without definite corresponding precontrast T1 hyperintensity (series 1030, image 5). Trace degenerative endplate edema and enhancement at L5-S1. Conus medullaris: Extends to the L1-L2 level and appears normal. Paraspinal  and other soft tissues: Paraspinal soft tissues within normal limits. Disc levels: Lumbar spondylosis with findings most notably as follows. At L3-L4, there is mild disc degeneration. Posterior annular fissure. Disc bulge with mild endplate spurring. Superimposed bilateral foraminal disc protrusions (more prominent on the left). Facet arthrosis. No more than mild relative spinal canal narrowing is appreciated. Mild bilateral neural foraminal narrowing. At L4-L5, there is mild disc degeneration. Disc bulge with mild endplate spurring. Facet arthrosis. No more than mild relative spinal canal narrowing is appreciated. Bilateral neural foraminal narrowing (mild right, mild/moderate left). At L5-S1, there is a disc bulge. Superimposed broad-based central disc protrusion eccentric to the right at site of posterior  annular fissure with mild caudal migration. Endplate spurring. Facet arthrosis. No more than mild relative central canal narrowing is appreciated. No significant foraminal stenosis. Impression #2 will be called to the ordering clinician or representative by the Radiologist Assistant, and communication documented in the PACS or [employer]. IMPRESSION: 1. Diffuse abnormal T1 hypointense marrow signal throughout the cervical, thoracic, lumbar and visualized sacral spine compatible with the given history. There are subcentimeter lesions within the T10 and T11 vertebral bodies which demonstrate precontrast T1 hyperintensity and are favored to reflect hemangiomas. A 3 mm enhancing lesion within the L3 vertebral body has no convincing precontrast T1 hyperintensity. This lesion may reflect a lipid-poor hemangioma or a small multiple myeloma lesion. Interval MRI follow-up is recommended to ensure stability. 2. At C5-C6, there is multifactorial severe spinal canal stenosis with at least mild spinal cord flattening. Apparent T2 hyperintense signal abnormality within the right aspect of the spinal cord at this level, which may reflect myelomalacia or focal edema. Bilateral neural foraminal narrowing also present at this level (severe right, moderate left). 3. Thoracic spondylosis, as outlined. No more than mild relative spinal canal stenosis is appreciated. No compressive neural foraminal narrowing identified. 4. Lumbar spondylosis, as outlined. No more than mild central canal stenosis is appreciated. Neural foraminal narrowing bilaterally at L3-L4 (mild) and bilaterally at L4-L5 (mild right, mild/moderate left. Electronically Signed   By: Kellie Simmering DO   On: 10/05/2020 12:23    ASSESSMENT & PLAN:  Myeloproliferative neoplasm [facility]) The patient carries dual diagnosis of polycythemia vera and multiple myeloma In the scope of things, I believe it is more important to treat his multiple myeloma His chemotherapy might  control his polycythemia For now, he will continue aspirin therapy We will evaluate the splenic lesion with further imaging study next week  Multiple myeloma without remission ([place]) The patient have worsening light chain studies He had abnormal MRI findings; while anemia or hemangioma can cause abnormal changes, I felt that he will likely need treatment soon Overall, I feel that this is most consistent with progression of smoldering myeloma to full-blown multiple myeloma I discussed and reviewed briefly the overall approach to treatment for multiple myeloma We discussed potential use of combination treatment with Velcade, Revlimid and dexamethasone; with his young age, he would also benefit from bone marrow transplant in the future Before we jump to conclusions and start him on treatment, I would like to see results of imaging study before we proceed I have addressed questions and concerns to the best of my ability today  Splenic lesion We will evaluate further with MRI next week  No orders of the defined types were placed in this encounter.   All questions were answered. The patient knows to call the clinic with any problems, questions or concerns. The total time spent in  the appointment was 40 minutes encounter with patients including review of chart and various tests results, discussions about plan of care and coordination of care plan   Heath Lark, MD 10/13/2020 5:31 PM

## 2020-10-14 ENCOUNTER — Encounter (HOSPITAL_COMMUNITY): Payer: Self-pay

## 2020-10-14 ENCOUNTER — Ambulatory Visit (HOSPITAL_COMMUNITY): Payer: 59 | Admitting: Hematology

## 2020-10-19 ENCOUNTER — Ambulatory Visit (HOSPITAL_COMMUNITY)
Admission: RE | Admit: 2020-10-19 | Discharge: 2020-10-19 | Disposition: A | Discharge status: Home / Self Care | Payer: 59 | Source: Ambulatory Visit | Attending: Physician Assistant | Admitting: Physician Assistant

## 2020-10-19 ENCOUNTER — Other Ambulatory Visit: Payer: Self-pay

## 2020-10-19 DIAGNOSIS — R161 Splenomegaly, not elsewhere classified: Secondary | ICD-10-CM | POA: Diagnosis not present

## 2020-10-19 DIAGNOSIS — R1900 Intra-abdominal and pelvic swelling, mass and lump, unspecified site: Secondary | ICD-10-CM | POA: Diagnosis present

## 2020-10-19 MED ORDER — GADOBUTROL 1 MMOL/ML IV SOLN
9.0000 mL | Freq: Once | INTRAVENOUS | Status: AC | PRN
  Administered 2020-10-19: 9 mL via INTRAVENOUS

## 2020-10-20 ENCOUNTER — Ambulatory Visit (HOSPITAL_COMMUNITY): Payer: 59 | Admitting: Physician Assistant

## 2020-10-20 ENCOUNTER — Encounter (HOSPITAL_COMMUNITY): Payer: Self-pay

## 2020-10-21 ENCOUNTER — Encounter (HOSPITAL_COMMUNITY): Payer: Self-pay

## 2020-10-21 ENCOUNTER — Ambulatory Visit (HOSPITAL_COMMUNITY): Admission: RE | Admit: 2020-10-21 | Payer: 59 | Source: Ambulatory Visit

## 2020-10-21 NOTE — Progress Notes (Signed)
LATE ENTRY:   I spoke with both the patient and his wife on 10/20/2020 at around 1600. They were offered MD appts for 10/21/2020 with Dr. Delton Coombes, 10/26/2020 with Dr. Lorenso Courier and 10/27/2020 with Dr. Jacklynn Lewis. Patient reports frustration and declines all offered appts at this time. Patient reports that he would prefer to see one MD and does not wish to seek differing opinions, again patient was offered appts with both Dr. Delton Coombes and Dr. Alvy Bimler who he has previously seen and he adamantly declines. Patient advised that next available appointment cannot be determined at this time. Patient states that he will call back when he is ready to schedule. The phone call is then ended abruptly.

## 2020-10-27 ENCOUNTER — Encounter (HOSPITAL_COMMUNITY): Payer: Self-pay | Admitting: Hematology and Oncology

## 2020-10-27 ENCOUNTER — Other Ambulatory Visit (HOSPITAL_COMMUNITY): Payer: Self-pay

## 2020-10-27 ENCOUNTER — Inpatient Hospital Stay (HOSPITAL_COMMUNITY): Payer: 59 | Attending: Hematology | Admitting: Hematology and Oncology

## 2020-10-27 ENCOUNTER — Encounter (HOSPITAL_COMMUNITY): Payer: Self-pay | Admitting: Lab

## 2020-10-27 ENCOUNTER — Other Ambulatory Visit: Payer: Self-pay

## 2020-10-27 VITALS — BP 156/93 | HR 59 | Temp 97.0°F | Resp 18 | Wt 203.9 lb

## 2020-10-27 DIAGNOSIS — D471 Chronic myeloproliferative disease: Secondary | ICD-10-CM | POA: Diagnosis not present

## 2020-10-27 DIAGNOSIS — C9000 Multiple myeloma not having achieved remission: Secondary | ICD-10-CM

## 2020-10-27 DIAGNOSIS — D45 Polycythemia vera: Secondary | ICD-10-CM | POA: Diagnosis present

## 2020-10-27 DIAGNOSIS — Z5112 Encounter for antineoplastic immunotherapy: Secondary | ICD-10-CM | POA: Diagnosis present

## 2020-10-27 MED ORDER — PROCHLORPERAZINE MALEATE 10 MG PO TABS: 10.0000 mg | ORAL_TABLET | Freq: Four times a day (QID) | ORAL | 1 refills | Status: DC | PRN

## 2020-10-27 MED ORDER — DEXAMETHASONE 4 MG PO TABS: ORAL_TABLET | ORAL | 3 refills | Status: DC

## 2020-10-27 MED ORDER — ACYCLOVIR 400 MG PO TABS: 400.0000 mg | ORAL_TABLET | Freq: Two times a day (BID) | ORAL | 3 refills | Status: DC

## 2020-10-27 MED ORDER — ONDANSETRON HCL 8 MG PO TABS: 8.0000 mg | ORAL_TABLET | Freq: Two times a day (BID) | ORAL | 1 refills | Status: DC | PRN

## 2020-10-27 MED ORDER — LENALIDOMIDE 20 MG PO CAPS: 20.0000 mg | ORAL_CAPSULE | Freq: Every day | ORAL | 0 refills | Status: DC

## 2020-10-27 NOTE — Assessment & Plan Note (Addendum)
I have reviewed the plan of care with the patient and wife I recommend we pursue treatment for multiple myeloma with induction chemotherapy  This is based on FDA approval and publications as follows  Lenalidomide, bortezomib, and dexamethasone combination therapy in patients with newly diagnosed multiple myeloma Paul G. Richardson, Edie Weller, Sagar Lonial, Andrzej J. Jakubowiak, Sundar Jagannath, Noopur S. Raje, David E. Avigan, Wanling Xie, Irene M. Ghobrial, Rennie L. Schlossman, [name], Nikhil C. Munshi, David H. Vesole, Robin Joyce, Jonathan L. Kaufman, Deborah Doss, Diane L. Warren, Laura E. Lunde, Sarah Kaster, Carol DeLaney, [name], Constantine S. Mitsiades, Alexis Knight, [name], Kenneth C. Anderson  Blood (2010) 116 (5): 679-686. https://doi.[email or web address]/blood-[email or web address]  This phase 1/2 study is the first prospective evaluation of lenalidomide-bortezomib-dexamethasone in front-line myeloma. Patients (N = 66) received 3-week cycles (n = 8) of bortezomib 1.0 or 1.3 mg/m2 (days 1, 4, 8, 11), lenalidomide 15 to 25 mg (days 1-14), and dexamethasone 40 or 20 mg (days 1, 2, 4, 5, 8, 9, 11, 12). Responding patients proceeded to maintenance or transplantation. Phase 2 dosing was determined to be bortezomib 1.3 mg/m2, lenalidomide 25 mg, and dexamethasone 20 mg. Most common toxicities included sensory neuropathy (80%) and fatigue (64%), with only 27%/2% and 32%/3% grade 2/3, respectively. In addition, 32% reported neuropathic pain (11%/3%, grade 2/3). Grade 3/4 hematologic toxicities included lymphopenia (14%), neutropenia (9%), and thrombocytopenia (6%). Thrombosis was rare (6% overall), and no treatment-related mortality was observed. Rate of partial response was 100% in both the phase 2 population and overall, with 74% and 67% each achieving very good partial response or better. Twenty-eight patients (42%) proceeded to undergo transplantation. With median follow-up  of 21 months, estimated 18-month progression-free and overall survival for the combination treatment with/without transplantation were 75% and 97%, respectively. Lenalidomide-bortezomib-dexamethasone demonstrates favorable tolerability and is highly effective in the treatment of newly diagnosed myeloma. This study is registered at http://clinicaltrials.gov as [id number].  We discussed the risk, benefits, side effects of combination treatment of lenalidomide, bortezomib with dexamethasone, including risk of infection, severe peripheral neuropathy, pancytopenia, risk of infection, thrombosis, hospitalization and death  I recommend weekly Velcade, weekly dexamethasone at 20 mg and Revlimid on days 1-14, rest 7 days as induction chemotherapy I recommend weekly Velcade rather than treatment with days 1, 4, 7 and 11 due to pre-existing mild neuropathy We will refer him to [facility] to discuss the role of autologous stem cell transplant  The patient agreed to proceed with the plan of care  I recommend acyclovir for antimicrobial prophylaxis I recommend him to continue aspirin for DVT prophylaxis We discussed the importance of dental clearance before we proceed with Zometa He is reminded to take calcium with vitamin D supplement We will order myeloma panel once a month I anticipate he will achieve very good partial response within 3 to 4 months and then he can proceed with bone marrow transplant after that  

## 2020-10-27 NOTE — Assessment & Plan Note (Addendum)
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 For now, I do not recommend treatment His blood count will likely improve while on chemotherapy

## 2020-10-27 NOTE — Progress Notes (Unsigned)
Referral sent to [facility] for BMT.  Records faxed to [phone].  They will review records and call pt with appt

## 2020-10-27 NOTE — Telephone Encounter (Signed)
Order received for Revlimid 20mg  14 days on, 7 days off and prescription has been sent to [facility] per Dr. Alvy Bimler

## 2020-10-27 NOTE — Progress Notes (Signed)
[facility] OFFICE PROGRESS NOTE  Patient Care Team: David Roberson as PCP - General (Internal Medicine)  ASSESSMENT & PLAN:  Multiple myeloma without remission ([place]) I have reviewed the plan of care with the patient and wife I recommend we pursue treatment for multiple myeloma with induction chemotherapy  This is based on FDA approval and publications as follows  Lenalidomide, bortezomib, and dexamethasone combination therapy in patients with newly diagnosed multiple myeloma David Roberson. David Roberson, [address] [place], David Roberson, David Roberson, David Roberson David Roberson. David Roberson, David Roberson, David Roberson, David Roberson, David Roberson, David Roberson, David Roberson, David Roberson, David Roberson, [name], David Roberson, David Roberson, David Roberson  Blood (2010) 116 (5): [id number]. [email or web address].org/  This phase 1/2 study is the first prospective evaluation of lenalidomide-bortezomib-dexamethasone in front-line myeloma. Patients (N = 66) received 3-week cycles (n = 8) of bortezomib 1.0 or 1.3 mg/m2 (days 1, 4, 8, 11), lenalidomide 15 to 25 mg (days 1-14), and dexamethasone 40 or 20 mg (days 1, 2, 4, 5, 8, 9, 11, 12). Responding patients proceeded to maintenance or transplantation. Phase 2 dosing was determined to be bortezomib 1.3 mg/m2, lenalidomide 25 mg, and dexamethasone 20 mg. Most common toxicities included sensory neuropathy (80%) and fatigue (64%), with only 27%/2% and 32%/3% grade 2/3, respectively. In addition, 32% reported neuropathic pain (11%/3%, grade 2/3). Grade 3/4 hematologic toxicities included lymphopenia (14%), neutropenia (9%), and thrombocytopenia (6%). Thrombosis was rare (6% overall), and no treatment-related mortality was observed. Rate of partial response was 100% in both the  phase 2 population and overall, with 74% and 67% each achieving very good partial response or better. Twenty-eight patients (42%) proceeded to undergo transplantation. With median follow-up of 21 months, estimated 41-monthprogression-free and overall survival for the combination treatment with/without transplantation were 75% and 97%, respectively. Lenalidomide-bortezomib-dexamethasone demonstrates favorable tolerability and is highly effective in the treatment of newly diagnosed myeloma. This study is registered at http://clinicaltrials.gov as David Roberson  We discussed the risk, benefits, side effects of combination treatment of lenalidomide, bortezomib with dexamethasone, including risk of infection, severe peripheral neuropathy, pancytopenia, risk of infection, thrombosis, hospitalization and death  I recommend weekly Velcade, weekly dexamethasone at 20 mg and Revlimid on days 1-14, rest 7 days as induction chemotherapy I recommend weekly Velcade rather than treatment with days 1, 4, 7 and 11 due to pre-existing mild neuropathy We will refer him to David Roberson to discuss the role of autologous stem cell transplant  The patient agreed to proceed with the plan of care  I recommend acyclovir for antimicrobial prophylaxis I recommend him to continue aspirin for DVT prophylaxis We discussed the importance of dental clearance before we proceed with Zometa He is reminded to take calcium with vitamin D supplement We will order myeloma panel once a month I anticipate he will achieve very good partial response within 3 to 4 months and then he can proceed with bone marrow transplant after that   Myeloproliferative neoplasm (David Roberson 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 For now, I do not recommend treatment His blood count will likely improve while on chemotherapy  Orders Placed This Encounter  Procedures   CBC with Differential ([place])     Standing Status:   Standing    Number of Occurrences:   20    Standing Expiration Date:  10/27/2021   CMP (Cancer Roberson only)    Standing Status:   Standing    Number of Occurrences:   20    Standing Expiration Date:   10/27/2021   Kappa/lambda light chains    Standing Status:   Standing    Number of Occurrences:   22    Standing Expiration Date:   10/27/2021   Multiple Myeloma Panel (SPEP&IFE w/QIG)    Standing Status:   Standing    Number of Occurrences:   22    Standing Expiration Date:   10/27/2021   Ambulatory referral to Bone Marrow Transplant Team    Referral Priority:   Routine    Referral Type:   Transplants    Referral Reason:   Specialty Services Required    Referral Location:   [facility]    Requested Specialty:   Transplant    Number of Visits Requested:   1   PHYSICIAN COMMUNICATION ORDER    Order aspirin 81-352m OR Coumadin for thromboembolic prophylaxis via "add orders". Target Coumadin INR = 2-3.    All questions were answered. The patient knows to call the clinic with any problems, questions or concerns. The total time spent in the appointment was 40 minutes encounter with patients including review of chart and various tests results, discussions about plan of care and coordination of care plan   David Lark MD 10/27/2020 10:42 AM  INTERVAL HISTORY: Please see below for problem oriented charting. He returns to review test results He feels well  SUMMARY OF ONCOLOGIC HISTORY: Oncology History  [place]  08/16/2020 Bone Marrow Biopsy   BONE MARROW, ASPIRATE, CLOT, CORE:  -Hypercellular bone marrow with myeloproliferative neoplasm  -Plasma cell neoplasm  -See comment   PERIPHERAL BLOOD:  -Microcytic-hypochromic anemia  -Leukocytosis  -Thrombocytosis   COMMENT:   The bone marrow is markedly hypercellular with pan myeloid proliferation including increased number of megakaryocytes with abnormal morphology. No  increase in blastic cells identified.  This is associated with mild reticulin fibrosis.  Given the previously documented positivity for JAK2 mutation in peripheral blood, the overall myeloid changes are consistent with a myeloproliferative neoplasm.  In this setting, the main considerations include iron-deficient polycythemia vera, early primary myelofibrosis.  In addition, the plasma cells are increased representing 5% of all cells in the aspirate associated with numerous predominantly small clusters in the clot and biopsy sections.  The plasma cells represent an estimated 10% by CD138 immunohistochemistry and show kappa light chain restriction consistent with plasma cell neoplasm. Correlation with cytogenetic and FISH studies is recommended.   10/05/2020 Imaging   [place]dom[place] 1. Splenomegaly with splenic volume of 1210.2 cm 2. 1.6 cm indeterminate lesion within the inferior spleen. Consider further evaluation with MRI   10/05/2020 Imaging   MR total spine 1. Diffuse abnormal T1 hypointense marrow signal throughout the cervical, thoracic, lumbar and visualized sacral spine compatible with the given history. There are subcentimeter lesions within the T10 and T11 vertebral bodies which demonstrate precontrast T1 hyperintensity and are favored to reflect hemangiomas. A 3 mm enhancing lesion within the L3 vertebral body has no convincing precontrast T1 hyperintensity. This lesion may reflect a lipid-poor hemangioma or a small multiple myeloma lesion. Interval MRI follow-up is recommended to ensure stability. 2. At C5-C6, there is multifactorial severe spinal canal stenosis with at least mild spinal cord flattening. Apparent T2 hyperintense signal abnormality within the right aspect of the spinal cord at this level, which may  reflect myelomalacia or focal edema. Bilateral neural foraminal narrowing also present at this level (severe right, moderate left). 3. Thoracic spondylosis, as outlined. No more than mild  relative spinal canal stenosis is appreciated. No compressive neural foraminal narrowing identified. 4. Lumbar spondylosis, as outlined. No more than mild central canal stenosis is appreciated. Neural foraminal narrowing bilaterally at L3-L4 (mild) and bilaterally at L4-L5 (mild right, mild/moderate left.   10/07/2020 Imaging   No focal lytic or blastic bone lesions identified. Incidental findings as noted above.   10/13/2020 Initial Diagnosis   Multiple myeloma without remission ([place])   11/03/2020 -  Chemotherapy    Patient is on Treatment Plan: MYELOMA  RVD SQ Q21D X 4 CYCLES         REVIEW OF SYSTEMS:   Constitutional: Denies fevers, chills or abnormal weight loss Eyes: Denies blurriness of vision Ears, nose, mouth, throat, and face: Denies mucositis or sore throat Respiratory: Denies cough, dyspnea or wheezes Cardiovascular: Denies palpitation, chest discomfort or lower extremity swelling Gastrointestinal:  Denies nausea, heartburn or change in bowel habits Skin: Denies abnormal skin rashes Lymphatics: Denies new lymphadenopathy or easy bruising Neurological:Denies numbness, tingling or new weaknesses Behavioral/Psych: Mood is stable, no new changes  All other systems were reviewed with the patient and are negative.  I have reviewed the past medical history, past surgical history, social history and family history with the patient and they are unchanged from previous note.  ALLERGIES:  is allergic to hydralazine, losartan, metronidazole, and piperacillin sod-tazobactam so.  MEDICATIONS:  Current Outpatient Medications  Medication Sig Dispense Refill   amLODipine (NORVASC) 10 MG tablet Take 10 mg by mouth daily.     aspirin EC 81 MG tablet Take 81 mg by mouth daily. Swallow whole.     clopidogrel (PLAVIX) 75 MG tablet Take 1 tablet by mouth daily.     dexamethasone (DECADRON) 4 MG tablet Take 20 mg once a week by mouth with food 20 tablet 3   guanFACINE (INTUNIV) 1 MG TB24  ER tablet Take 1 tablet by mouth daily.     Nebivolol HCl 20 MG TABS Take 1 tablet by mouth daily.     NP THYROID 60 MG tablet Take 60 mg by mouth daily.     Omega-3 Fatty Acids (FISH OIL) 1000 MG CAPS Take by mouth.     pantoprazole (PROTONIX) 40 MG tablet Take 40 mg by mouth daily.     rosuvastatin (CRESTOR) 40 MG tablet Take 1 tablet by mouth daily.     tiZANidine (ZANAFLEX) 4 MG tablet Take 4 mg by mouth 3 (three) times daily.     Vitamin D, Cholecalciferol, 25 MCG (1000 UT) TABS Take 1,000 Units by mouth daily.     acyclovir (ZOVIRAX) 400 MG tablet Take 1 tablet (400 mg total) by mouth 2 (two) times daily. 60 tablet 3   ALPRAZolam (XANAX) 0.25 MG tablet Take 1 tablet by mouth as needed. (Patient not taking: Reported on 10/27/2020)     lenalidomide (REVLIMID) 20 MG capsule Take 1 capsule (20 mg total) by mouth daily. 14 days on, 7 days off 14 capsule 0   ondansetron (ZOFRAN) 8 MG tablet Take 1 tablet (8 mg total) by mouth 2 (two) times daily as needed (Nausea or vomiting). 30 tablet 1   prochlorperazine (COMPAZINE) 10 MG tablet Take 1 tablet (10 mg total) by mouth every 6 (six) hours as needed (Nausea or vomiting). 30 tablet 1   sulfacetamide (BLEPH-10) 10 % ophthalmic solution SMARTSIG:1-2 Drop(s)  Right Eye Every 2 Hours PRN (Patient not taking: Reported on 10/27/2020)     No current facility-administered medications for this visit.    PHYSICAL EXAMINATION: ECOG PERFORMANCE STATUS: 1 - Symptomatic but completely ambulatory  Vitals:   10/27/20 0811  BP: (!) 156/93  Pulse: (!) 59  Resp: 18  Temp: (!) 97 F (36.1 C)  SpO2: 98%   Filed Weights   10/27/20 0811  Weight: 203 lb 14.4 oz (92.5 kg)    GENERAL:alert, no distress and comfortable NEURO: alert & oriented x 3 with fluent speech, no focal motor/sensory deficits  LABORATORY DATA:  I have reviewed the data as listed    Component Value Date/Time   NA 135 09/16/2020 1452   K 4.0 09/16/2020 1452   CL 104 09/16/2020 1452    CO2 24 09/16/2020 1452   GLUCOSE 103 (H) 09/16/2020 1452   BUN 20 09/16/2020 1452   CREATININE 1.16 09/16/2020 1452   CALCIUM 8.7 (L) 09/16/2020 1452   PROT 6.7 09/16/2020 1452   ALBUMIN 4.3 09/16/2020 1452   AST 23 09/16/2020 1452   ALT 28 09/16/2020 1452   ALKPHOS 73 09/16/2020 1452   BILITOT 0.7 09/16/2020 1452   GFRNONAA >60 09/16/2020 1452    No results found for: SPEP, UPEP  Lab Results  Component Value Date   WBC 17.0 (H) 09/16/2020   NEUTROABS 11.2 (H) 09/16/2020   HGB 14.1 09/16/2020   HCT 51.0 09/16/2020   MCV 66.8 (L) 09/16/2020   PLT 733 (H) 09/16/2020      Chemistry      Component Value Date/Time   NA 135 09/16/2020 1452   K 4.0 09/16/2020 1452   CL 104 09/16/2020 1452   CO2 24 09/16/2020 1452   BUN 20 09/16/2020 1452   CREATININE 1.16 09/16/2020 1452      Component Value Date/Time   CALCIUM 8.7 (L) 09/16/2020 1452   ALKPHOS 73 09/16/2020 1452   AST 23 09/16/2020 1452   ALT 28 09/16/2020 1452   BILITOT 0.7 09/16/2020 1452       RADIOGRAPHIC STUDIES: I have personally reviewed the radiological images as listed and agreed with the findings in the report. MR Abdomen W Wo Contrast  Result Date: 10/20/2020 CLINICAL DATA:  Characterize inferior splenic lesion identified by prior ultrasound EXAM: MRI ABDOMEN WITHOUT AND WITH CONTRAST TECHNIQUE: Multiplanar multisequence MR imaging of the abdomen was performed both before and after the administration of intravenous contrast. CONTRAST:  60m GADAVIST GADOBUTROL 1 MMOL/ML IV SOLN COMPARISON:  Abdominal ultrasound, 10/05/2020 FINDINGS: Lower chest: No acute findings. Hepatobiliary: No mass or other parenchymal abnormality identified. Pancreas: No mass, inflammatory changes, or other parenchymal abnormality identified. Spleen:  Splenomegaly, maximum coronal span 15.1 cm. Adrenals/Urinary Tract: No solid masses identified. There is an intrinsically T1 hyperintense, hemorrhagic or proteinaceous cyst of the posterior  midportion of the left kidney without evidence of contrast enhancement (series 17, image 77). No evidence of hydronephrosis. Stomach/Bowel: Visualized portions within the abdomen are unremarkable. Vascular/Lymphatic: No pathologically enlarged lymph nodes identified. No abdominal aortic aneurysm demonstrated. Aortic atherosclerosis. Other:  None. Musculoskeletal: No suspicious bone lesions identified. IMPRESSION: 1. Splenomegaly, maximum coronal span 15.1 cm. 2. No focal lesion of the spleen to correspond to finding of prior ultrasound, of doubtful clinical significance. Aortic Atherosclerosis (ICD10-I70.0). Electronically Signed   By: AEddie CandleM.D.   On: 10/20/2020 08:33   [place]Abdomen Limited  Result Date: 10/05/2020 CLINICAL DATA:  Splenomegaly EXAM: ULTRASOUND ABDOMEN LIMITED COMPARISON:  None. FINDINGS:  Limited ultrasound of the left upper quadrant is performed. The spleen measures 13.3 x 11.3 x 15.4 cm with a volume of 1210.2 cm. Indeterminate mass within the anterior inferior spleen with echogenic rim and hypoechoic Roberson measuring 1.3 x 1.6 x 1.6 cm. IMPRESSION: 1. Splenomegaly with splenic volume of 1210.2 cm 2. 1.6 cm indeterminate lesion within the inferior spleen. Consider further evaluation with MRI. Electronically Signed   By: Donavan Foil M.D.   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 FINDINGS: No focal lytic or by blastic bone lesions are identified. Mild to moderate degenerative disc disease is noted within the cervical spine. Mild degenerative changes are noted throughout the thoracic spine. Minimal degenerative change noted within the lumbar spine. Vascular calcifications are seen within the aortoiliac vasculature. Dystrophic calcification noted within the right foreleg proximally possibly involving the proximal tibia  fibular syndesmosis. Distal left fibular ORIF has been performed with dystrophic calcification noted involving the distal tibiofibular syndesmosis. IMPRESSION: No focal lytic or blastic bone lesions identified. Incidental findings as noted above. Electronically Signed   By: Fidela Salisbury MD   On: 10/07/2020 09:10   MR TOTAL SPINE METS SCREENING  Result Date: 10/05/2020 CLINICAL DATA:  Provided history: MGUS (monoclonal gammopathy of unknown significance). MGUS versus multiple myeloma, concern for bone lesions. EXAM: MRI TOTAL SPINE WITHOUT AND WITH CONTRAST TECHNIQUE: Multisequence MR imaging of the spine from the cervical spine to the sacrum was performed prior to and following IV contrast administration for evaluation of spinal metastatic disease. CONTRAST:  68m GADAVIST GADOBUTROL 1 MMOL/ML IV SOLN COMPARISON:  No pertinent prior exams available for comparison. FINDINGS: MRI CERVICAL SPINE FINDINGS Alignment: Straightening of the expected cervical lordosis. Trace C5-C6 grade 1 retrolisthesis. Vertebrae: Diffuse abnormal T1 hypointense marrow signal consistent with the given history. No superimposed focal suspicious osseous lesion is identified. Cord: Apparent T2 hyperintense signal abnormality within the right aspect of the spinal cord at C5-C6, as described below. Posterior Fossa, vertebral arteries, paraspinal tissues: No abnormality identified within included portions of the posterior fossa. Flow voids preserved within the imaged cervical vertebral arteries. Paraspinal soft tissues within normal limits. Disc levels: Cervical spondylosis, most notably as follows. At C5-C6, there is moderate disc degeneration. Posterior disc osteophyte complex with bilateral disc osteophyte ridge/uncinate hypertrophy. Facet arthrosis (greater on the left). Ligamentum flavum hypertrophy. Severe spinal canal stenosis with at least mild spinal cord flattening. Apparent T2 hyperintense signal abnormality within the right  aspect of the spinal cord, which may reflect myelomalacia or focal edema (series 44, image 5). Bilateral neural foraminal narrowing (moderate/severe right, moderate left). MRI THORACIC SPINE FINDINGS Alignment:  No significant spondylolisthesis. Vertebrae: Diffuse abnormal T1 hypointense marrow signal throughout the thoracic spine compatible with the given history. Vertebral body height is maintained. Multilevel degenerative endplate irregularity with small Schmorl nodes. 8 mm T2/STIR hyperintense lesion within the T10 vertebral body. 4 mm T2/STIR hyperintense lesion within the T11 vertebral body. There is precontrast T1 hyperintense signal centrally within both of these lesions, and these are favored to reflect hemangiomas. Trace degenerative edema and enhancement along the T12 superior endplate. No focal suspicious osseous lesion is identified. Cord: No appreciable signal abnormality or abnormal cord enhancement on the acquired sagittal imaging. Paraspinal and other soft tissues: Paraspinal soft tissues within normal limits. Disc levels: No more than mild disc space narrowing at any level. Shallow  multilevel disc bulges. These disc bulges may slightly narrow the spinal canal, but there is no appreciable spinal cord mass effect. No appreciable compressive foraminal stenosis. MRI LUMBAR SPINE FINDINGS Segmentation: 5 lumbar vertebrae. The caudal most well-formed intervertebral disc space is designated L5-S1. Alignment:  No significant spondylolisthesis. Vertebrae: Diffuse abnormal T1 hypointense marrow signal throughout the lumbar and visualized sacral spine compatible with the given history. 3 mm T2/STIR hyperintense and enhancing lesion within the L3 vertebral body without definite corresponding precontrast T1 hyperintensity (series 1030, image 5). Trace degenerative endplate edema and enhancement at L5-S1. Conus medullaris: Extends to the L1-L2 level and appears normal. Paraspinal and other soft tissues:  Paraspinal soft tissues within normal limits. Disc levels: Lumbar spondylosis with findings most notably as follows. At L3-L4, there is mild disc degeneration. Posterior annular fissure. Disc bulge with mild endplate spurring. Superimposed bilateral foraminal disc protrusions (more prominent on the left). Facet arthrosis. No more than mild relative spinal canal narrowing is appreciated. Mild bilateral neural foraminal narrowing. At L4-L5, there is mild disc degeneration. Disc bulge with mild endplate spurring. Facet arthrosis. No more than mild relative spinal canal narrowing is appreciated. Bilateral neural foraminal narrowing (mild right, mild/moderate left). At L5-S1, there is a disc bulge. Superimposed broad-based central disc protrusion eccentric to the right at site of posterior annular fissure with mild caudal migration. Endplate spurring. Facet arthrosis. No more than mild relative central canal narrowing is appreciated. No significant foraminal stenosis. Impression #2 will be called to the ordering clinician or representative by the Radiologist Assistant, and communication documented in the PACS or [employer]. IMPRESSION: 1. Diffuse abnormal T1 hypointense marrow signal throughout the cervical, thoracic, lumbar and visualized sacral spine compatible with the given history. There are subcentimeter lesions within the T10 and T11 vertebral bodies which demonstrate precontrast T1 hyperintensity and are favored to reflect hemangiomas. A 3 mm enhancing lesion within the L3 vertebral body has no convincing precontrast T1 hyperintensity. This lesion may reflect a lipid-poor hemangioma or a small multiple myeloma lesion. Interval MRI follow-up is recommended to ensure stability. 2. At C5-C6, there is multifactorial severe spinal canal stenosis with at least mild spinal cord flattening. Apparent T2 hyperintense signal abnormality within the right aspect of the spinal cord at this level, which may reflect  myelomalacia or focal edema. Bilateral neural foraminal narrowing also present at this level (severe right, moderate left). 3. Thoracic spondylosis, as outlined. No more than mild relative spinal canal stenosis is appreciated. No compressive neural foraminal narrowing identified. 4. Lumbar spondylosis, as outlined. No more than mild central canal stenosis is appreciated. Neural foraminal narrowing bilaterally at L3-L4 (mild) and bilaterally at L4-L5 (mild right, mild/moderate left. Electronically Signed   By: Kellie Simmering DO   On: 10/05/2020 12:23

## 2020-10-27 NOTE — Progress Notes (Signed)
START ON PATHWAY REGIMEN - Multiple Myeloma and Other Plasma Cell Dyscrasias     A cycle is every 21 days:     Bortezomib      Lenalidomide      Dexamethasone   **Always confirm dose/schedule in your pharmacy ordering system**  Patient Characteristics: Multiple Myeloma, Newly Diagnosed, Transplant Eligible, Standard Risk Disease Classification: Multiple Myeloma R-ISS Staging: II Therapeutic Status: Newly Diagnosed Is Patient Eligible for Transplant<= Transplant Eligible Risk Status: Standard Risk Intent of Therapy: Curative Intent, Discussed with Patient 

## 2020-11-02 ENCOUNTER — Other Ambulatory Visit: Payer: Self-pay

## 2020-11-02 ENCOUNTER — Telehealth (HOSPITAL_COMMUNITY): Payer: Self-pay | Admitting: Pharmacy Technician

## 2020-11-02 ENCOUNTER — Other Ambulatory Visit (HOSPITAL_COMMUNITY): Payer: Self-pay

## 2020-11-02 ENCOUNTER — Inpatient Hospital Stay (HOSPITAL_COMMUNITY): Payer: 59

## 2020-11-02 ENCOUNTER — Encounter (HOSPITAL_COMMUNITY): Payer: Self-pay | Admitting: Hematology and Oncology

## 2020-11-02 NOTE — Progress Notes (Signed)

## 2020-11-02 NOTE — Telephone Encounter (Signed)
Oral Oncology Patient Advocate Encounter   Received notification from [place] that prior authorization for Revlimid is required.   PA submitted on CoverMyMeds Key [id number] Status is pending   Oral Oncology Clinic will continue to follow.  [place] Patient [place] Phone [phone] Fax [phone] 11/02/2020 11:56 AM

## 2020-11-02 NOTE — Progress Notes (Signed)
Pharmacist Chemotherapy Monitoring - Initial Assessment    Anticipated start date: 11/08/20   The following has been reviewed per standard work regarding the patient's treatment regimen: The patient's diagnosis, treatment plan and drug doses, and organ/hematologic function Lab orders and baseline tests specific to treatment regimen  The treatment plan start date, drug sequencing, and pre-medications Prior authorization status  Patient's documented medication list, including drug-drug interaction screen and prescriptions for anti-emetics and supportive care specific to the treatment regimen The drug concentrations, fluid compatibility, administration routes, and timing of the medications to be used The patient's access for treatment and lifetime cumulative dose history, if applicable  The patient's medication allergies and previous infusion related reactions, if applicable   Changes made to treatment plan:  N/A  Follow up needed:  N/A   Wynona Neat, [facility], 11/02/2020  12:17 PM

## 2020-11-02 NOTE — Telephone Encounter (Signed)
Oral Oncology Patient Advocate Encounter  Prior Authorization for Revlimid has been approved.    PA# [id number] Effective dates: 11/02/20 through 11/02/21  Patients co-pay is $2639.34.  Will follow up with [facility] about status of prescription.  [facility] will continue to follow.   [facility] Phone [phone] Fax [phone] 11/02/2020 12:31 PM

## 2020-11-02 NOTE — Patient Instructions (Addendum)
[place] are diagnosed with multiple myeloma.  You will be treated in the clinic weekly with a drug called bortezomib (Velcade).  You will also be taking a chemotherapy pill (Revlimid) at home - 2 weeks on and 1 week off every 21 days.  Part of your treatment is also high dose steroid - dexamethasone 20 mg weekly, taken at home on the days you receive the Velcade injection. The intent of this treatment is to get you prepared for bone marrow transplantation.  You will see the doctor regularly throughout treatment.  We will obtain blood work from you prior to every treatment and monitor your results to make sure it is safe to give your treatment. The doctor monitors your response to treatment by the way you are feeling, your blood work, and by obtaining scans periodically.  There will be wait times while you are here for treatment.  It will take about 30 minutes to 1 hour for your lab work to result.  Then there will be wait times while pharmacy mixes your medications.    Bortezomib (Velcade)  About This Drug  Bortezomib is used to treat cancer. It is given by a shot under the skin (subcutaneously).    Possible Side Effects   Bone marrow suppression. Decrease in the number of white blood cells, red blood cells, and platelets. This may raise your risk of infection, make you tired and weak (fatigue), and raise your risk of bleeding.   Nausea and vomiting (throwing up)   Constipation (not able to move bowels)   Diarrhea (loose bowel movements)   Fever   Tiredness   Decreased appetite (decreased hunger)   Effects on the nerves are called peripheral neuropathy. You may feel numbness, tingling, or pain in your hands and feet. It may be hard for you to button your clothes, open jars, or walk as usual. The effect on the nerves may get worse with more doses of the drug. These effects get better in some people after the drug is stopped but it does not  get better in all people.   Rash  Note: Each of the side effects above was reported in 20% or greater of patients treated with bortezomib. Not all possible side effects are included above.  Warnings and Precautions   Severe peripheral neuropathy   Low blood pressure   Congestive heart failure - your heart has less ability to pump blood properly.   Trouble breathing because of fluid build-up and/or inflammation in your lungs   Nausea, vomiting, diarrhea and constipation which sometimes requires treatment to help lessen these side effects. There is also an increased risk of developing a partial or complete blockage of your small and/or large intestine.   Changes in your central nervous system can happen. The central nervous system is made up of your brain and spinal cord. You could feel extreme tiredness, agitation, confusion, have hallucinations (see or hear things that are not there), trouble understanding or speaking, loss of control of your bowels or bladder, eyesight changes, numbness or lack of strength to your arms, legs, face, or body, seizures or coma. If you start to have any of these symptoms let your doctor know right away.   Tumor lysis syndrome: This drug may act on the cancer cells very quickly. This may affect how your kidneys work.   Changes in your liver function  Increased risk of a syndrome that affects your red blood cells, platelets and blood  vessels in your kidneys, which can cause kidney failure and be life-threatening.  Important Information   This drug may be present in the saliva, tears, sweat, urine, stool, vomit, semen, and vaginal secretions. Talk to your doctor and/or your nurse about the necessary precautions to take during this time.   This drug may impair your ability to drive or use machinery. Use caution and tell your nurse or doctor if you feel dizzy, very sleepy, and/or experience low blood pressure.  Treating Side Effects   Manage tiredness by  pacing your activities for the day.   Be sure to include periods of rest between energy-draining activities.   To decrease the risk of infection, wash your hands regularly.   Avoid close contact with people who have a cold, the flu, or other infections.   Take your temperature as your doctor or nurse tells you, and whenever you feel like you may have a fever.   To help decrease the risk of bleeding, use a soft toothbrush. Check with your nurse before using dental floss.   Be very careful when using knives or tools.   Use an electric shaver instead of a razor.   Ask your doctor or nurse about medicines that are available to help stop or lessen constipation.   If you are not able to move your bowels, check with your doctor or nurse before you use enemas, laxatives, or suppositories.   Drink plenty of fluids (a minimum of eight glasses per day is recommended).   If you throw up or have loose bowel movements, you should drink more fluids so that you do not become dehydrated (lack of water in the body from losing too much fluid).   If you have diarrhea, eat low-fiber foods that are high in protein and calories and avoid foods that can irritate your digestive tracts or lead to cramping.   Ask your nurse or doctor about medicine that can lessen or stop your diarrhea.   To help with nausea and vomiting, eat small, frequent meals instead of three large meals a day. Choose foods and drinks that are at room temperature. Ask your nurse or doctor about other helpful tips and medicine that is available to help stop or lessen these symptoms.   To help with decreased appetite, eat foods high in calories and protein, such as meat, poultry, fish, dry beans, tofu, eggs, nuts, milk, yogurt, cheese, ice cream, pudding, and nutritional supplements.   Consider using sauces and spices to increase taste. Daily exercise, with your doctor's approval, may increase your appetite.   If you have numbness and  tingling in your hands and feet, be careful when cooking, walking, and handling sharp objects and hot liquids.   If you get a rash do not put anything on it unless your doctor or nurse says you may. Keep the area around the rash clean and dry. Ask your doctor for medicine if your rash bothers you.  Food and Drug Interactions   This drug may interact with grapefruit and grapefruit juice. Talk to your doctor as this could make side effects worse.   Check with your doctor or pharmacist about all other prescription medicines and over-the-counter medicines and dietary supplements (vitamins, minerals, herbs and others) you are taking before starting this medicine as there are known drug interactions with bortezomib. Also, check with your doctor or pharmacist before starting any new prescription or over-the-counter medicines, or dietary supplements to make sure that there are no interactions.   Avoid  the use of St. John's Wort with bortezomib as this may lower the levels of the drug in your body, which can make it less effective.  When to Call the Doctor  Call your doctor or nurse if you have any of these symptoms and/or any new or unusual symptoms:   Fever of 100.4 F (38 C) or higher   Chills   Tiredness that interferes with your daily activities   Feeling dizzy or lightheaded   Feeling that your heart is beating in a fast or not normal way (palpitations)   Cough   Wheezing or trouble breathing   Easy bleeding or bruising   Confusion and/or agitation   Hallucinations   Trouble understanding or speaking   Blurry vision or changes in your eyesight   Numbness or lack of strength to your arms, legs, face, or body   Symptoms of a seizure such as confusion, blacking out, passing out, loss of hearing or vision, blurred vision, unusual smells or tastes (such as burning rubber), trouble talking, tremors or shaking in parts or all of the body, repeated body movements, tense muscles that  do not relax, and loss of control of urine and bowels. If you or your family member suspects you are having a seizure, call 911 right away.   Nausea that stops you from eating or drinking and/or is not relieved by prescribed medicines   Throwing up    Lasting loss of appetite or rapid weight loss of five pounds in a week   No bowel movement in 3 days or when you feel uncomfortable.   Abdominal pain that does not go away   Diarrhea, 4 times in one day or diarrhea with lack of strength or a feeling of being dizzy   Numbness, tingling, or pain your hands and feet   Swelling of legs, ankles, and/or feet   Weight gain of 5 pounds in one week (fluid retention)   Decreased urine, or very dark urine   New rash and/or itching   Rash that is not relieved by prescribed medicines   Signs of tumor lysis: Confusion or agitation, decreased urine, nausea/vomiting, diarrhea, muscle cramping, numbness and/or tingling, seizures.   Signs of possible liver problems: dark urine, pale bowel movements, bad stomach pain, feeling very tired and weak, unusual itching, or yellowing of the eyes or skin   If you think you are pregnant or may have impregnated your partner  Reproduction Warnings   Pregnancy warning: This drug can have harmful effects on the unborn baby. Women of child bearing potential should use effective methods of birth control during your cancer treatment and for at least 7 months after treatment. Men with male partners of childbearing potential should use effective methods of birth control during your cancer treatment and for at least 4 months after your cancer treatment. Let your doctor know right away if you think you may be pregnant or may have impregnated your partner.   Breastfeeding warning: Women should not breastfeed during treatment and for 2 months month after treatment because this drug could enter the breast milk and cause harm to a breastfeeding baby.   Fertility warning:  In men and women both, this drug may affect your ability to have children in the future. Talk with your doctor or nurse if you plan to have children. Ask for information on sperm or egg banking.   Lenalidomide (Revlimid)  About This Drug Lenalidomide is used to treat cancer. It is given orally (by mouth).  Possible Side Effects  Bone marrow suppression. This is a decrease in the number of white blood cells, red blood cells, and platelets. This may raise your risk of infection, make you tired and weak (fatigue), and raise your risk of bleeding.   Nausea   Diarrhea (loose bowel movements)   Constipation (unable to move bowels)   Inflammation of your stomach and/or intestines   Pain in your abdomen or back pain   Fever   Tiredness and weakness   Swelling of your legs, ankles and/or feet   Decreased appetite (decreased hunger)   Muscle cramps/spasms   Pain in your joints   Headache   Feeling dizzy   Tremor   Trouble sleeping   Nosebleed   Upper respiratory infection, bronchitis   Inflammation of the nasal passages and throat   Trouble breathing   Cough   Rash and itching  Note: Each of the side effects above was reported in 15% or greater of patients treated with lenalidomide. Not all possible side effects are included above.  Warnings and Precautions  Blood clots and events such as stroke and heart attack. A blood clot in your leg may cause your leg to swell, appear red and warm, and/or cause pain. A blood clot in your lungs may cause trouble breathing, pain when breathing, and/or chest pain.   Severe bone marrow suppression   Changes in your liver function, which may cause liver failure and be life-threatening.   Tumor lysis syndrome: This drug may act on the cancer cells very quickly. This may affect how your kidneys work and can be life-threatening.   Changes in your thyroid function   Severe allergic skin reaction which may be life-threatening. You  may develop blisters on your skin that are filled with fluid or a severe red rash all over your body that may be painful.   This drug may raise your risk of getting a second cancer.   You may develop a syndrome called tumor flare reaction. You may have painful lymph nodes, enlarged spleen, fever, and a rash.   This drug may make it more difficult to collect your stem cells if a stem cell transplant is part of your treatment plan.   There is a rare increased risk of death in patients with chronic lymphocytic leukemia and a risk of early death (dying sooner) in patient with mantle cell lymphoma.   Allergic reactions, including anaphylaxis are rare but may happen in some patients. Signs of allergic reaction to this drug may be swelling of the face, feeling like your tongue or throat are swelling, trouble breathing, rash, itching, fever, chills, feeling dizzy, and/or feeling that your heart is beating in a fast or not normal way. If this happens, do not take another dose of this drug. You should get urgent medical treatment.  Note: Some of the side effects above are very rare. If you have concerns and/or questions, please discuss them with your medical team.  Important Information  You will need to sign up for a special program called Revlimid REMS when you start taking this drug. Your nurse will help you get started.   Two negative pregnancy tests are required in women of childbearing potential prior to starting treatment. Routine pregnancy tests are required during treatment.   Do not donate blood during your treatment and for 4 weeks after your treatment.   Men should not donate sperm during your treatment and for 4 weeks after your treatment because this drug is present  in semen and may badly harm a baby.   How to Take Your Medication  Swallow the medicine whole with water, with or without food. Do not chew, break, or open it.   Take this medicine at about the same time each day   Missed  dose: If you miss a dose, take it as soon as you think about it ONLY if it has been less than 12 hours since you normally take the missed dose. If it has been more than 12 hours, skip the missed dose and contact your physician. Take your next dose at the regular time. Do not take 2 doses at the same time and do not double up on the next dose.   If you vomit a dose, take your next dose at the regular time.   Handling: Wash your hands after handling your medicine, your caretakers should not handle your medicine with bare hands and should wear latex gloves.   If you get any of the content of a broken capsules on your skin, you should wash the area of the skin well with soap and water right away. Call your doctor if you get a skin reaction.   This drug may be present in the saliva, tears, sweat, urine, stool, vomit, semen, and vaginal secretions. Talk to your doctor and/or your nurse about the necessary precautions to take during this time.   Storage: Store this medicine in the original container at room temperature.   Disposal of unused medicine: Do not flush any expired and/or unused medicine down the toilet or drain unless you are specifically instructed to do so on the medication label. Some facilities have take-back programs and/or other options. If you do not have a take-back program in your area, then please discuss with your nurse or your doctor how to dispose of unused medicine.  Treating Side Effects  Manage tiredness by pacing your activities for the day.   Be sure to include periods of rest between energy-draining activities.   If you are dizzy, get up slowly after sitting or lying.   To decrease the risk of infection, wash your hands regularly.   Avoid close contact with people who have a cold, the flu, or other infections.   Take your temperature as your doctor or nurse tells you, and whenever you feel like you may have a fever.   To help decrease the risk of bleeding, use a  soft toothbrush. Check with your nurse before using dental floss.   Be very careful when using knives or tools.   Use an electric shaver instead of a razor.   Ask your doctor or nurse about medicines that are available to help stop or lessen constipation and/or diarrhea.   If you are not able to move your bowels, check with your doctor or nurse before you use enemas, laxatives, or suppositories.   Drink plenty of fluids (a minimum of eight glasses per day is recommended).   Drink fluids that contribute calories (whole milk, juice, soft drinks, sweetened beverages, milkshakes, and nutritional supplements) instead of water.   If you throw up or have loose bowel movements, you should drink more fluids so that you do not become dehydrated (lack of water in the body from losing too much fluid).   If you have diarrhea, eat low-fiber foods that are high in protein and calories and avoid foods that can irritate your digestive tracts or lead to cramping.   To help with nausea and vomiting, eat small, frequent  meals instead of three large meals a day.  Choose foods and drinks that are at room temperature. Ask your nurse or doctor about other helpful tips and medicine that is available to help stop or lessen these symptoms.   To help with decreased appetite, eat small, frequent meals. Eat foods high in calories and protein, such as meat, poultry, fish, dry beans, tofu, eggs, nuts, milk, yogurt, cheese, ice cream, pudding, and nutritional supplements.   Consider using sauces and spices to increase taste. Daily exercise, with your doctor's approval, may increase your appetite.   If you get a rash, do not put anything on it unless your doctor or nurse says you may. Keep the area around the rash clean and dry. Ask your doctor for medicine if your rash bothers you.   Keeping your pain under control is important to your well-being. Please tell your doctor or nurse if you are experiencing pain.   If you  are having trouble sleeping, talk to your nurse or doctor on tips to help you sleep better.   If you have a nosebleed, sit with your head tipped slightly forward. Apply pressure by lightly pinching the bridge of your nose between your thumb and forefinger. Call your doctor if you feel dizzy or faint or if the bleeding does not stop after 10 to 15 minutes.   Moisturize your skin several times a day.   Avoid sun exposure and apply sunscreen routinely when outdoors.  Food and Drug Interactions   There are no known interactions of lenalidomide with food.   Check with your doctor or pharmacist about all other prescription medicines and over-the-counter medicines and dietary supplements (vitamins, minerals, herbs, and others) you are taking before starting this medicine as there are known drug interactions with lenalidomide. Also, check with your doctor or pharmacist before starting any new prescription or over-the-counter medicines, or dietary supplements to make sure that there are no interactions.   There are known interactions of lenalidomide with blood-thinning medicine such as warfarin. Ask your doctor what precautions you should take.  When to Call the Doctor Call your doctor or nurse if you have any of these symptoms and/or any new or unusual symptoms:  Fever of 100.4 F (38 C) or higher   Chills   Tiredness that interferes with your daily activities   Feeling dizzy or lightheaded   Easy bleeding or bruising   Your leg or arm is swollen, red, warm, and/or painful   Headache that does not go away   Nosebleed that does not stop bleeding after 10-15 minutes   Painful lymph nodes   Wheezing and/or trouble breathing   Chest pain or symptoms of a heart attack. Most heart attacks involve pain in the center of the chest that lasts more than a few minutes. The pain may go away and come back. It can feel like pressure, squeezing, fullness, or pain. Sometimes pain is felt in one or  both arms, the back, neck, jaw, or stomach. If any of these symptoms last 2 minutes, call 911.   Symptoms of a stroke such as sudden numbness or weakness of your face, arm, or leg, mostly on one side of your body; sudden confusion, trouble speaking or understanding; sudden trouble seeing in one or both eyes; sudden trouble walking, feeling dizzy, loss of balance or coordination; or sudden, bad headache with no known cause. If you have any of these symptoms for 2 minutes, call 911.   Signs of allergic reaction: swelling  of the face, feeling like your tongue or throat are swelling, trouble breathing, rash, itching, fever, chills, feeling dizzy, and/or feeling that your heart is beating in a fast or not normal way. If this happens, call 911 for emergency care.   Coughing up yellow, green, or bloody mucus   Feeling that your heart is beating in a fast or not normal way (palpitations)   Nausea that stops you from eating or drinking and/or is not relieved by prescribed medicines   Throwing up more than 3 times a day   Loose bowel movements (diarrhea) 4 times a day or loose bowel movements with lack of strength or a feeling of being dizzy   No bowel movement in 3 days or when you feel uncomfortable   Trouble falling or staying asleep   Pain in your abdomen that does not go away   Weight gain of 5 pounds in one week (fluid retention)   Swelling of your legs, ankles and/or feet   Unexplained weight gain   Lasting loss of appetite or rapid weight loss of five pounds in a week   Pain that does not go away, or is not relieved by prescribed medicines   Flu-like symptoms: fever, headache, muscle and joint aches, and fatigue (low energy, feeling weak)   A new rash or itching that is not relieved by prescribed medicines   Signs of possible liver problems: dark urine, pale bowel movements, bad stomach pain, feeling very tired and weak, unusual itching, or yellowing of the eyes or skin   Signs of  tumor lysis: confusion or agitation, decreased urine, nausea/vomiting, diarrhea, muscle cramping, numbness and/or tingling, seizures   If you think you may be pregnant or may have impregnated your partner  Reproduction Warnings   Pregnancy warning: This drug can have harmful effects on the unborn baby. Women of childbearing potential must commit to abstain from heterosexual intercourse or use 2 effective methods of birth control, one of which, must be a highly effective method of birth control, beginning at least 4 weeks before treatment starts, during your cancer treatment, including dose interruptions, and for at least 4 weeks after treatment. A highly effective method of birth control includes tubal ligation, intrauterine device (IUD), hormonal (birth control pills, injections, patch and/or implants) or a partner's vasectomy. Stop taking lenalidomide immediately and let your doctor know right away if you think you may be pregnant, miss your menstrual period, or experience unusual menstrual bleeding.   Men with male partners of childbearing potential should use effective methods of birth control during your cancer treatment and for at least 4 weeks after your cancer treatment.    Breastfeeding warning: Women should not breastfeed during treatment because this drug could enter the breast milk and cause harm to a breastfeeding baby.   Fertility warning: Human fertility studies have not been done with this drug. Talk with your doctor or nurse if you plan to have children. Ask for information on sperm or egg banking.   Dexamethasone (Decadron)  About This Drug  Dexamethasone is used to treat cancer, to decrease inflammation and sometimes used before and after chemotherapy to prevent or treat nausea and/or vomiting. It is given in the vein (IV) or orally (by mouth).  Possible Side Effects   Headache   High blood pressure   Abnormal heart beat   Tiredness and weakness   Changes in mood,  which may include depression or a feeling of extreme well-being   Trouble sleeping   Increased  sweating   Increased appetite (increased hunger)   Weight gain   Increase risk of infections   Pain in your abdomen   Nausea   Skin changes such as rash, dryness, redness   Blood sugar levels may change   Electrolyte changes   Swelling of your legs, ankles and/or feet   Changes in your liver function   You may be at risk for cataracts, glaucoma or infections of the eye   Muscle loss and / or weakness (lack of muscle strength)   Increased risk of developing osteoporosis- your bones may become weak and brittle  Note: Not all possible side effects are included above.  Warnings and Precautions   This drug may cause you to feel irritable, nervous or restless.   Allergic reactions, including anaphylaxis are rare but may happen in some patients. Signs of allergic reaction to this drug may be swelling of the face, feeling like your tongue or throat are swelling, trouble breathing, rash, itching, fever, chills, feeling dizzy, and/or feeling that your heart is beating in a fast or not normal way. If this happens, do not take another dose of this drug. You should get urgent medical treatment.   High blood pressure and changes in electrolytes, which can cause fluid build-up around your heart, lungs or elsewhere.   Increased risk of developing a hole in your stomach, small, and/or large intestine if you have ulcers in the lining of your stomach and/or intestine, or have diverticulitis, ulcerative colitis and/or other diseases that affect the gastrointestinal tract.   Effects on the endocrine glands including the pituitary, adrenals or thyroid during or after use of this medication.   Changes in the tissue of the heart, that can cause your heart to have less ability to pump blood. You may be short of breath or our arms, hands, legs and feet may swell.   Increased risk of heart attack.    Severe depression and other psychiatric disorders such as mood changes.   Burning, pain and itching around your anus may happen when this drug is given in the vein too rapidly (IV). It usually happens suddenly and resolves in less than 1 minute.  Important Information   Talk to your doctor or your nurse before stopping this medication, it should be stopped gradually. Depending on the dose and length of treatment, you could experience serious side effects if stopped abruptly (suddenly).   Talk to your doctor before receiving any vaccinations during your treatment. Some vaccinations are not recommended while receiving dexamethasone.  How to Take Your Medication   For Oral (by mouth): You can take the medicine with or without food. If you have nausea or upset stomach, take it with food.   Missed dose: If you miss a dose, do not take 2 doses at the same time or extra doses.  If you vomit a dose, take your next dose at the regular time. Do not take 2 doses at the same time   Handling: Wash your hands after handling your medicine, your caretakers should not handle your medicine with bare hands and should wear latex gloves.   Storage: Store this medicine in the original container at room temperature. Protect from moisture and light. Discuss with your nurse or your doctor how to dispose of unused medicine.   Treating Side Effects   Drink plenty of fluids (a minimum of eight glasses per day is recommended).   To help with nausea and vomiting, eat small, frequent meals instead of three  large meals a day. Choose foods and drinks that are at room temperature. Ask your nurse or doctor about other helpful tips and medicine that is available to help stop or lessen these symptoms.   If you throw up, you should drink more fluids so that you do not become dehydrated (lack of water in the body from losing too much fluid).   Manage tiredness by pacing your activities for the day.   Be sure to include  periods of rest between energy-draining activities.   To help with muscle weakness, get regular exercise. If you feel too tired to exercise vigorously, try taking a short walk.   If you are having trouble sleeping, talk to your nurse or doctor on tips to help you sleep better.   If you are feeling depressed, talk to your nurse or doctor about it.   Keeping your pain under control is important to your well-being. Please tell your doctor or nurse if you are experiencing pain.   If you have diabetes, keep good control of your blood sugar level. Tell your nurse or your doctor if your glucose levels are higher or lower than normal.   To decrease the risk of infection, wash your hands regularly.   Avoid close contact with people who have a cold, the flu, or other infections.   Take your temperature as your doctor or nurse tells you, and whenever you feel like you may have a fever.   If you get a rash do not put anything on it unless your doctor or nurse says you may. Keep the area around the rash clean and dry. Ask your doctor for medicine if your rash bothers you.   Moisturize your skin several times day.   Avoid sun exposure and apply sunscreen routinely when outdoors.  Food and Drug Interactions   There are no known interactions of dexamethasone with food.   Check with your doctor or pharmacist about all other prescription medicines and over-the-counter medicines and dietary supplements (vitamins, minerals, herbs and others) you are taking before starting this medicine as there are known drug interactions with dexamethasone. Also, check with your doctor or pharmacist before starting any new prescription or over-the-counter medicines, or dietary supplement to make sure that there are no interactions.   There are known interactions of dexamethasone with other medicines and products like acetaminophen, aspirin, and ibuprofen. Ask your doctor what over-the-counter (OTC) medicines you can  take.  When to Call the Doctor  Call your doctor or nurse if you have any of these symptoms and/or any new or unusual symptoms:   Fever of 100.4 F (38 C) or higher   Chills   A headache that does not go away   Trouble breathing   Blurry vision or other changes in eyesight   Feel irritable, nervous or restless   Trouble falling or staying asleep   Severe mood changes such as depression or unusual thoughts and/or behaviors   Thoughts of hurting yourself or others, and suicide   Tiredness that interferes with your daily activities   Feeling that your heart is beating in a fast, slow or not normal way   Feeling dizzy or lightheaded   Chest pain or symptoms of a heart attack. Most heart attacks involve pain in the center of the chest that lasts more than a few minutes. The pain may go away and come back, or it can be constant. It can feel like pressure, squeezing, fullness, or pain. Sometimes pain is felt  in one or both arms, the back, neck, jaw, or stomach. If any of these symptoms last 2 minutes, call 911.   Heartburn or indigestion   Nausea that stops you from eating or drinking and/or is not relieved by prescribed medicines   Throwing up more than 3 times a day   Pain in your abdomen that does not go away   Abnormal blood sugar   Unusual thirst, passing urine often, headache, sweating, shakiness, irritability   Swelling of legs, ankles, or feet   Weight gain of 5 pounds in one week (fluid retention)   Signs of possible liver problems: dark urine, pale bowel movements, bad stomach pain, feeling very tired and weak, unusual itching, or yellowing of the eyes or skin   Severe muscle weakness   A new rash or a rash that is not relieved by prescribed medicines   Signs of allergic reaction: swelling of the face, feeling like your tongue or throat are swelling, trouble breathing, rash, itching, fever, chills, feeling dizzy, and/or feeling that your heart is beating in a  fast or not normal way. If this happens, call 911 for emergency care.   If you think you may be pregnant  Reproduction Warnings   Pregnancy warning: It is not known if this drug may harm an unborn child. For this reason, be sure to talk with your doctor if you are pregnant or planning to become pregnant while receiving this drug. Let your doctor know right away if you think you may be pregnant or may have impregnated your partner.   Breastfeeding warning: It is not known if this drug passes into breast milk. For this reason, women should talk to their doctor about the risks and benefits of breastfeeding during treatment with this drug because this drug may enter the breast milk and cause harm to a breastfeeding baby.   Fertility warning: Human fertility studies have not been done with this drug. Talk with your doctor or nurse if you plan to have children. Ask for information on sperm banking.    SELF CARE ACTIVITIES WHILE ON CHEMOTHERAPY:  Hydration Increase your fluid intake 48 hours prior to treatment and drink at least 8 to 12 cups (64 ounces) of water/decaffeinated beverages per day after treatment. You can still have your cup of coffee or soda but these beverages do not count as part of your 8 to 12 cups that you need to drink daily. No alcohol intake.  Medications Continue taking your normal prescription medication as prescribed.  If you start any new herbal or new supplements please let us know first to make sure it is safe.  Mouth Care Have teeth cleaned professionally before starting treatment. Keep dentures and partial plates clean. Use soft toothbrush and do not use mouthwashes that contain alcohol. Biotene is a good mouthwash that is available at most pharmacies or may be ordered by calling [id number]. Use warm salt water gargles (1 teaspoon salt per 1 quart warm water) before and after meals and at bedtime. Or you may rinse with 2 tablespoons of three-percent hydrogen peroxide  mixed in eight ounces of water. If you are still having problems with your mouth or sores in your mouth please call the clinic. If you need dental work, please let the doctor know before you go for your appointment so that we can coordinate the best possible time for you in regards to your chemo regimen. You need to also let your dentist know that you are actively taking  chemo. We may need to do labs prior to your dental appointment.  Skin Care Always use sunscreen that has not expired and with SPF (Sun Protection Factor) of 50 or higher. Wear hats to protect your head from the sun. Remember to use sunscreen on your hands, ears, face, & feet.  Use good moisturizing lotions such as udder cream, eucerin, or even Vaseline. Some chemotherapies can cause dry skin, color changes in your skin and nails.    Avoid long, hot showers or baths. Use gentle, fragrance-free soaps and laundry detergent. Use moisturizers, preferably creams or ointments rather than lotions because the thicker consistency is better at preventing skin dehydration. Apply the cream or ointment within 15 minutes of showering. Reapply moisturizer at night, and moisturize your hands every time after you wash them.   Infection Prevention Please wash your hands for at least 30 seconds using warm soapy water. Handwashing is the #1 way to prevent the spread of germs. Stay away from sick people or people who are getting over a cold. If you develop respiratory systems such as green/yellow mucus production or productive cough or persistent cough let us know and we will see if you need an antibiotic. It is a good idea to keep a pair of gloves on when going into grocery stores/[employer] to decrease your risk of coming into contact with germs on the carts, etc. Carry alcohol hand gel with you at all times and use it frequently if out in public. If your temperature reaches 100.5 or higher please call the clinic and let us know.  If it is after hours or on  the weekend please go to the ER if your temperature is over 100.4.  Please have your own personal thermometer at home to use.    Sex and bodily fluids If you are going to have sex, a condom must be used to protect the person that isn't taking immunotherapy. For a few days after treatment, immunotherapy can be excreted through your bodily fluids.  When using the toilet please close the lid and flush the toilet twice.  Do this for a few day after you have had immunotherapy.   Contraception It is not known for sure whether or not immunotherapy drugs can be passed on through semen or secretions from the vagina. Because of this some doctors advise people to use a barrier method if you have sex during treatment. This applies to vaginal, anal or oral sex.  Generally, doctors advise a barrier method only for the time you are actually having the treatment and for about a week after your treatment.  Advice like this can be worrying, but this does not mean that you have to avoid being intimate with your partner. You can still have close contact with your partner and continue to enjoy sex.  Animals If you have cats or birds we just ask that you not change the litter or change the cage.  Please have someone else do this for you while you are on immunotherapy.   Food Safety During and After Cancer Treatment Food safety is important for people both during and after cancer treatment. Cancer and cancer treatments, such as chemotherapy, radiation therapy, and stem cell/bone marrow transplantation, often weaken the immune system. This makes it harder for your body to protect itself from foodborne illness, also called food poisoning. Foodborne illness is caused by eating food that contains harmful bacteria, parasites, or viruses.  Foods to avoid Some foods have a higher risk of becoming tainted  with bacteria. These include: Unwashed fresh fruit and vegetables, especially leafy vegetables that can hide dirt and other  contaminants Raw sprouts, such as alfalfa sprouts Raw or undercooked beef, especially ground beef, or other raw or undercooked meat and poultry Fatty, fried, or spicy foods immediately before or after treatment.  These can sit heavy on your stomach and make you feel nauseous. Raw or undercooked shellfish, such as oysters. Sushi and sashimi, which often contain raw fish.  Unpasteurized beverages, such as unpasteurized fruit juices, raw milk, raw yogurt, or cider Undercooked eggs, such as soft boiled, over easy, and poached; raw, unpasteurized eggs; or foods made with raw egg, such as homemade raw cookie dough and homemade mayonnaise  Simple steps for food safety  Shop smart. Do not buy food stored or displayed in an unclean area. Do not buy bruised or damaged fruits or vegetables. Do not buy cans that have cracks, dents, or bulges. Pick up foods that can spoil at the end of your shopping trip and store them in a cooler on the way home.  Prepare and clean up foods carefully. Rinse all fresh fruits and vegetables under running water, and dry them with a clean towel or paper towel. Clean the top of cans before opening them. After preparing food, wash your hands for 20 seconds with hot water and soap. Pay special attention to areas between fingers and under nails. Clean your utensils and dishes with hot water and soap. Disinfect your kitchen and cutting boards using 1 teaspoon of liquid, unscented bleach mixed into 1 quart of water.    Dispose of old food. Eat canned and packaged food before its expiration date (the "use by" or "best before" date). Consume refrigerated leftovers within 3 to 4 days. After that time, throw out the food. Even if the food does not smell or look spoiled, it still may be unsafe. Some bacteria, such as Listeria, can grow even on foods stored in the refrigerator if they are kept for too long.  Take precautions when eating out. At restaurants, avoid buffets and salad  bars where food sits out for a long time and comes in contact with many people. Food can become contaminated when someone with a virus, often a norovirus, or another "bug" handles it. Put any leftover food in a "to-go" container yourself, rather than having the server do it. And, refrigerate leftovers as soon as you get home. Choose restaurants that are clean and that are willing to prepare your food as you order it cooked.    SYMPTOMS TO REPORT AS SOON AS POSSIBLE AFTER TREATMENT:  FEVER GREATER THAN 100.4 F CHILLS WITH OR WITHOUT FEVER NAUSEA AND VOMITING THAT IS NOT CONTROLLED WITH YOUR NAUSEA MEDICATION UNUSUAL SHORTNESS OF BREATH UNUSUAL BRUISING OR BLEEDING TENDERNESS IN MOUTH AND THROAT WITH OR WITHOUT PRESENCE OF ULCERS URINARY PROBLEMS BOWEL PROBLEMS UNUSUAL RASH     Wear comfortable clothing and clothing appropriate for easy access to any Portacath or PICC line. Let us know if there is anything that we can do to make your therapy better!   What to do if you need assistance after hours or on the weekends: CALL [phone].  HOLD on the line, do not hang up.  You will hear multiple messages but at the end you will be connected with a nurse triage line.  They will contact the doctor if necessary.  Most of the time they will be able to assist you.  Do not call the hospital operator.  I have been informed and understand all of the instructions given to me and have received a copy. I have been instructed to call the clinic [phone] or my family physician as soon as possible for continued medical care, if indicated. I do not have any more questions at this time but understand that I may call the [facility] or the Patient Navigator at [phone] during office hours should I have questions or need assistance in obtaining follow-up care.

## 2020-11-03 ENCOUNTER — Other Ambulatory Visit (HOSPITAL_COMMUNITY): Payer: Self-pay | Admitting: Family Medicine

## 2020-11-07 ENCOUNTER — Other Ambulatory Visit (HOSPITAL_COMMUNITY): Payer: Self-pay | Admitting: Hematology and Oncology

## 2020-11-07 DIAGNOSIS — C9000 Multiple myeloma not having achieved remission: Secondary | ICD-10-CM

## 2020-11-08 ENCOUNTER — Inpatient Hospital Stay (HOSPITAL_COMMUNITY): Payer: 59

## 2020-11-08 ENCOUNTER — Encounter (HOSPITAL_COMMUNITY): Payer: Self-pay | Admitting: Hematology and Oncology

## 2020-11-08 ENCOUNTER — Other Ambulatory Visit: Payer: Self-pay

## 2020-11-08 VITALS — BP 158/86 | HR 63 | Temp 97.2°F | Resp 18 | Wt 203.2 lb

## 2020-11-08 DIAGNOSIS — D471 Chronic myeloproliferative disease: Secondary | ICD-10-CM

## 2020-11-08 DIAGNOSIS — C9000 Multiple myeloma not having achieved remission: Secondary | ICD-10-CM

## 2020-11-08 DIAGNOSIS — Z5112 Encounter for antineoplastic immunotherapy: Secondary | ICD-10-CM | POA: Diagnosis not present

## 2020-11-08 LAB — CBC WITH DIFFERENTIAL/PLATELET
Abs Immature Granulocytes: 0.62 10*3/uL — ABNORMAL HIGH (ref 0.00–0.07)
Basophils Absolute: 0.7 10*3/uL — ABNORMAL HIGH (ref 0.0–0.1)
Basophils Relative: 4 %
Eosinophils Absolute: 0.5 10*3/uL (ref 0.0–0.5)
Eosinophils Relative: 3 %
HCT: 62.7 % — ABNORMAL HIGH (ref 39.0–52.0)
Hemoglobin: 17.7 g/dL — ABNORMAL HIGH (ref 13.0–17.0)
Immature Granulocytes: 3 %
Lymphocytes Relative: 9 %
Lymphs Abs: 1.7 10*3/uL (ref 0.7–4.0)
MCH: 19.4 pg — ABNORMAL LOW (ref 26.0–34.0)
MCHC: 28.2 g/dL — ABNORMAL LOW (ref 30.0–36.0)
MCV: 68.8 fL — ABNORMAL LOW (ref 80.0–100.0)
Monocytes Absolute: 0.5 10*3/uL (ref 0.1–1.0)
Monocytes Relative: 2 %
Neutro Abs: 15.6 10*3/uL — ABNORMAL HIGH (ref 1.7–7.7)
Neutrophils Relative %: 79 %
Platelets: 662 10*3/uL — ABNORMAL HIGH (ref 150–400)
RBC: 9.11 MIL/uL — ABNORMAL HIGH (ref 4.22–5.81)
RDW: 24.7 % — ABNORMAL HIGH (ref 11.5–15.5)
WBC: 19.7 10*3/uL — ABNORMAL HIGH (ref 4.0–10.5)
nRBC: 0.3 % — ABNORMAL HIGH (ref 0.0–0.2)

## 2020-11-08 LAB — COMPREHENSIVE METABOLIC PANEL
ALT: 25 U/L (ref 0–44)
AST: 20 U/L (ref 15–41)
Albumin: 4.3 g/dL (ref 3.5–5.0)
Alkaline Phosphatase: 81 U/L (ref 38–126)
Anion gap: 11 (ref 5–15)
BUN: 14 mg/dL (ref 6–20)
CO2: 23 mmol/L (ref 22–32)
Calcium: 9.4 mg/dL (ref 8.9–10.3)
Chloride: 102 mmol/L (ref 98–111)
Creatinine, Ser: 0.92 mg/dL (ref 0.61–1.24)
GFR, Estimated: >60 mL/min (ref >60)
Glucose, Bld: 101 mg/dL — ABNORMAL HIGH (ref 70–99)
Potassium: 4.5 mmol/L (ref 3.5–5.1)
Sodium: 136 mmol/L (ref 135–145)
Total Bilirubin: 0.6 mg/dL (ref 0.3–1.2)
Total Protein: 6.8 g/dL (ref 6.5–8.1)

## 2020-11-08 LAB — LACTATE DEHYDROGENASE: LDH: 390 U/L — ABNORMAL HIGH (ref 98–192)

## 2020-11-08 MED ORDER — BORTEZOMIB CHEMO SQ INJECTION 3.5 MG (2.5MG/ML)
1.3000 mg/m2 | Freq: Once | INTRAMUSCULAR | Status: AC
  Administered 2020-11-08: 2.75 mg via SUBCUTANEOUS
  Filled 2020-11-08: qty 1.1

## 2020-11-08 NOTE — Patient Instructions (Signed)
[facility]  Discharge Instructions: Thank you for choosing [facility] to provide your oncology and hematology care.  If you have a lab appointment with the [facility], please come in thru the Main Entrance and check in at the main information desk.  Wear comfortable clothing and clothing appropriate for easy access to any Portacath or PICC line.   We strive to give you quality time with your provider. You may need to reschedule your appointment if you arrive late (15 or more minutes).  Arriving late affects you and other patients whose appointments are after yours.  Also, if you miss three or more appointments without notifying the office, you may be dismissed from the clinic at the provider's discretion.      For prescription refill requests, have your pharmacy contact our office and allow 72 hours for refills to be completed.    Today you received the following chemotherapy and/or immunotherapy agents: Velcade      To help prevent nausea and vomiting after your treatment, we encourage you to take your nausea medication as directed.  BELOW ARE SYMPTOMS THAT SHOULD BE REPORTED IMMEDIATELY: *FEVER GREATER THAN 100.4 F (38 C) OR HIGHER *CHILLS OR SWEATING *NAUSEA AND VOMITING THAT IS NOT CONTROLLED WITH YOUR NAUSEA MEDICATION *UNUSUAL SHORTNESS OF BREATH *UNUSUAL BRUISING OR BLEEDING *URINARY PROBLEMS (pain or burning when urinating, or frequent urination) *BOWEL PROBLEMS (unusual diarrhea, constipation, pain near the anus) TENDERNESS IN MOUTH AND THROAT WITH OR WITHOUT PRESENCE OF ULCERS (sore throat, sores in mouth, or a toothache) UNUSUAL RASH, SWELLING OR PAIN  UNUSUAL VAGINAL DISCHARGE OR ITCHING   Items with * indicate a potential emergency and should be followed up as soon as possible or go to the Emergency Department if any problems should occur.  Please show the CHEMOTHERAPY ALERT CARD or IMMUNOTHERAPY ALERT CARD at check-in to the Emergency  Department and triage nurse.  Should you have questions after your visit or need to cancel or reschedule your appointment, please contact [facility] [phone]  and follow the prompts.  Office hours are 8:00 a.m. to 4:30 p.m. Monday - Friday. Please note that voicemails left after 4:00 p.m. may not be returned until the following business day.  We are closed weekends and major holidays. You have access to a nurse at all times for urgent questions. Please call the main number to the clinic [phone] and follow the prompts.  For any non-urgent questions, you may also contact your provider using MyChart. We now offer e-Visits for anyone 56 and older to request care online for non-urgent symptoms. For details visit [email or web address].   Also download the MyChart app! Go to the app store, search "MyChart", open the app, select [facility], and log in with your MyChart username and password.  Due to Covid, a mask is required upon entering the hospital/clinic. If you do not have a mask, one will be given to you upon arrival. For doctor visits, patients may have 1 support person aged 17 or older with them. For treatment visits, patients cannot have anyone with them due to current Covid guidelines and our immunocompromised population.

## 2020-11-08 NOTE — Progress Notes (Signed)
Patient here today for D1C1 Velcade.  He took his first Revlimid dose and dexamethasone today around 10am.  He took it with food to offset any potential nausea.  I explained to him that it may not cause any GI upset but if it does he can switch to taking it at night.  He doesn't have any questions about his treatment today.  He verbalizes that he asked them all at this point.  Patient remained stable during his injection. He was discharged ambulatory and in stable condition to self. He will follow up next week as scheduled.

## 2020-11-09 LAB — KAPPA/LAMBDA LIGHT CHAINS
Kappa free light chain: 285.8 mg/L — ABNORMAL HIGH (ref 3.3–19.4)
Kappa, lambda light chain ratio: 38.11 — ABNORMAL HIGH (ref 0.26–1.65)
Lambda free light chains: 7.5 mg/L (ref 5.7–26.3)

## 2020-11-10 ENCOUNTER — Other Ambulatory Visit (HOSPITAL_COMMUNITY): Payer: Self-pay

## 2020-11-10 DIAGNOSIS — C9000 Multiple myeloma not having achieved remission: Secondary | ICD-10-CM

## 2020-11-10 NOTE — Progress Notes (Signed)
Notification received from lab that IFE and SPEP labs drawn on Monday 07/18 were rejected by [facility]. Dr. Delton Coombes made aware and labs will be recollected on Monday, 07/25. Orders placed and associated with upcoming appointments appropriately.

## 2020-11-15 ENCOUNTER — Inpatient Hospital Stay (HOSPITAL_COMMUNITY): Payer: 59

## 2020-11-15 ENCOUNTER — Ambulatory Visit (HOSPITAL_COMMUNITY): Payer: 59

## 2020-11-15 ENCOUNTER — Encounter (HOSPITAL_COMMUNITY): Payer: Self-pay

## 2020-11-15 ENCOUNTER — Inpatient Hospital Stay (HOSPITAL_COMMUNITY): Payer: 59 | Admitting: Hematology

## 2020-11-18 ENCOUNTER — Other Ambulatory Visit (HOSPITAL_COMMUNITY): Payer: Self-pay

## 2020-11-18 MED ORDER — LENALIDOMIDE 20 MG PO CAPS: 20.0000 mg | ORAL_CAPSULE | Freq: Every day | ORAL | 0 refills | Status: DC

## 2020-11-18 NOTE — Telephone Encounter (Signed)
Chart reviewed. Revlimid refilled per last office note with Dr. Alvy Bimler

## 2020-11-22 ENCOUNTER — Other Ambulatory Visit (HOSPITAL_COMMUNITY): Payer: 59

## 2020-11-22 ENCOUNTER — Ambulatory Visit (HOSPITAL_COMMUNITY): Payer: 59

## 2020-12-01 ENCOUNTER — Other Ambulatory Visit: Payer: Self-pay | Admitting: Hematology and Oncology

## 2020-12-06 NOTE — Progress Notes (Signed)
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, a 55 y.o. male, returns for routine follow-up for his polycythemia vera, iron deficiency anemia, and possible plasma cell dyscrasia. [name] was last seen on 09/16/20.  Today he reports feeling well. He was started on Hydrea on 11/19/20. He reports fatigue in the afternoons and evenings. He reports that he had pneumonia about 2 weeks ago.   REVIEW OF SYSTEMS:  Review of Systems  Constitutional:  Positive for fatigue (60%). Negative for appetite change.  All other systems reviewed and are negative.  PAST MEDICAL/SURGICAL HISTORY:  Past Medical History:  Diagnosis Date   Anemia    Blood transfusion without reported diagnosis    Diverticulitis    GERD (gastroesophageal reflux disease)    GI bleed    Hypertension    PONV (postoperative nausea and vomiting)    with colonoscopy   Past Surgical History:  Procedure Laterality Date   ANKLE FRACTURE SURGERY Left    COLONOSCOPY N/A 01/30/2017   Procedure: COLONOSCOPY;  Surgeon: Danie Binder, MD;  Location: [facility];  Service: Endoscopy;  Laterality: N/A;  [place] / REPAIR  2014   LIPOMA EXCISION     Neck   POLYPECTOMY  01/30/2017   Procedure: POLYPECTOMY;  Surgeon: Danie Binder, MD;  Location: [facility];  Service: Endoscopy;;  Ascending colon   VASECTOMY  2005    SOCIAL HISTORY:  Social History   Socioeconomic History   Marital status: Married    Spouse name: Not on file   Number of children: 4   Years of education: Not on file   Highest education level: Not on file   Occupational History   Occupation: Retired  Tobacco Use   Smoking status: Never   Smokeless tobacco: Former    Types: Chew    Quit date: 01/22/2020  Vaping Use   Vaping Use: Never used  Substance and Sexual Activity   Alcohol use: Yes    Comment: maybe 1 beer a night   Drug use: No   Sexual activity: Yes  Other Topics Concern   Not on file  Social History Narrative   Not on file   Social Determinants of Health   Financial Resource Strain: Low Risk    Difficulty of Paying Living Expenses: Not hard at all  Food Insecurity: No Food Insecurity   Worried About Charity fundraiser in the Last Year: Never true   [place] in the Last Year: Never true  Transportation Needs: No Transportation Needs   Lack of Transportation (Medical): No   Lack of Transportation (Non-Medical): No  Physical Activity: Insufficiently Active   Days of Exercise per Week: 2 days   Minutes of Exercise per Session: 30 min  Stress: No Stress Concern Present   Feeling of Stress : Not at all  Social Connections: Moderately Isolated   Frequency of Communication with Friends and Family: More than three times a week   Frequency of Social Gatherings with Friends and Family: Three times a week   Attends Religious Services: Never   Active Member of Clubs or Organizations: No  Attends Archivist Meetings: Never   Marital Status: Married  Human resources officer Violence: Not At Risk   Fear of Current or Ex-Partner: No   Emotionally Abused: No   Physically Abused: No   Sexually Abused: No    FAMILY HISTORY:  Family History  Problem Relation Age of Onset   Hypertension Mother    Hypertension Father    Stroke Paternal Grandmother    Heart disease Paternal Grandfather     CURRENT MEDICATIONS:  Current Outpatient Medications  Medication Sig Dispense Refill   acyclovir (ZOVIRAX) 400 MG tablet Take 1 tablet (400 mg total) by mouth 2 (two) times daily. 60 tablet 3   ALPRAZolam (XANAX) 0.25 MG  tablet Take 1 tablet by mouth as needed. (Patient not taking: Reported on 10/27/2020)     amLODipine (NORVASC) 10 MG tablet Take 10 mg by mouth daily.     aspirin EC 81 MG tablet Take 81 mg by mouth daily. Swallow whole.     BORTEZOMIB IJ Inject as directed once a week. Days 1, 8, 15 every 21 days     clopidogrel (PLAVIX) 75 MG tablet Take 1 tablet by mouth daily.     dexamethasone (DECADRON) 4 MG tablet Take 20 mg once a week by mouth with food 20 tablet 3   guanFACINE (INTUNIV) 1 MG TB24 ER tablet Take 1 tablet by mouth daily.     lenalidomide (REVLIMID) 20 MG capsule Take 1 capsule (20 mg total) by mouth daily. 14 days on, 7 days off 14 capsule 0   Nebivolol HCl 20 MG TABS Take 1 tablet by mouth daily.     NP THYROID 60 MG tablet Take 60 mg by mouth daily.     Omega-3 Fatty Acids (FISH OIL) 1000 MG CAPS Take by mouth.     ondansetron (ZOFRAN) 8 MG tablet Take 1 tablet (8 mg total) by mouth 2 (two) times daily as needed (Nausea or vomiting). 30 tablet 1   pantoprazole (PROTONIX) 40 MG tablet Take 40 mg by mouth daily.     prochlorperazine (COMPAZINE) 10 MG tablet TAKE 1 TABLET(10 MG) BY MOUTH EVERY 6 HOURS AS NEEDED FOR NAUSEA OR VOMITING 30 tablet 1   rosuvastatin (CRESTOR) 40 MG tablet Take 1 tablet by mouth daily.     sulfacetamide (BLEPH-10) 10 % ophthalmic solution SMARTSIG:1-2 Drop(s) Right Eye Every 2 Hours PRN (Patient not taking: Reported on 10/27/2020)     tiZANidine (ZANAFLEX) 4 MG tablet Take 4 mg by mouth 3 (three) times daily.     Vitamin D, Cholecalciferol, 25 MCG (1000 UT) TABS Take 1,000 Units by mouth daily.     No current facility-administered medications for this visit.    ALLERGIES:  Allergies  Allergen Reactions   Hydralazine Other (See Comments)   Losartan Other (See Comments)    Caused elevated potassium   Metronidazole Itching   Piperacillin Sod-Tazobactam So Itching    PHYSICAL EXAM:  Performance status (ECOG): 1 - Symptomatic but completely  ambulatory  There were no vitals filed for this visit. Wt Readings from Last 3 Encounters:  11/08/20 203 lb 3.2 oz (92.2 kg)  10/27/20 203 lb 14.4 oz (92.5 kg)  10/13/20 205 lb 4.8 oz (93.1 kg)   Physical Exam Vitals reviewed.  Constitutional:      Appearance: Normal appearance.  Cardiovascular:     Rate and Rhythm: Normal rate and regular rhythm.     Pulses: Normal pulses.     Heart sounds: Normal heart sounds.  Pulmonary:     Effort: Pulmonary effort is normal.     Breath sounds: Normal breath sounds.  Neurological:     General: No focal deficit present.     Mental Status: He is alert and oriented to person, place, and time.  Psychiatric:        Mood and Affect: Mood normal.        Behavior: Behavior normal.    LABORATORY DATA:  I have reviewed the labs as listed.  CBC Latest Ref Rng & Units 11/08/2020 09/16/2020 08/16/2020  WBC 4.0 - 10.5 K/uL 19.7(H) 17.0(H) 18.5(H)  Hemoglobin 13.0 - 17.0 g/dL 17.7(H) 14.1 11.9(L)  Hematocrit 39.0 - 52.0 % 62.7(H) 51.0 44.5  Platelets 150 - 400 K/uL 662(H) 733(H) 974(HH)   CMP Latest Ref Rng & Units 11/08/2020 09/16/2020 07/27/2020  Glucose 70 - 99 mg/dL 101(H) 103(H) 88  BUN 6 - 20 mg/dL _0 Creatinine 0.61 - 1.24 mg/dL 0.92 1.16 1.04  Sodium 135 - 145 mmol/L 136 135 138  Potassium 3.5 - 5.1 mmol/L 4.5 4.0 4.7  Chloride 98 - 111 mmol/L 102 104 105  CO2 22 - 32 mmol/L _1 Calcium 8.9 - 10.3 mg/dL 9.4 8.7(L) 9.1  Total Protein 6.5 - 8.1 g/dL 6.8 6.7 6.9  Total Bilirubin 0.3 - 1.2 mg/dL 0.6 0.7 0.7  Alkaline Phos 38 - 126 U/L 81 73 67  AST 15 - 41 U/L _2 ALT 0 - 44 U/L _3 Component Value Date/Time   RBC 9.11 (H) 11/08/2020 1219   MCV 68.8 (L) 11/08/2020 1219   MCH 19.4 (L) 11/08/2020 1219   MCHC 28.2 (L) 11/08/2020 1219   RDW 24.7 (H) 11/08/2020 1219   LYMPHSABS 1.7 11/08/2020 1219   MONOABS 0.5 11/08/2020 1219   EOSABS 0.5 11/08/2020 1219   BASOSABS 0.7 (H) 11/08/2020 1219    DIAGNOSTIC  IMAGING:  I have independently reviewed the scans and discussed with the patient. No results found.   ASSESSMENT:  1.  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   - Patient was briefly on Hydroxyurea for less than 2 months in 2018, has been on long-term Aspirin since diagnosis.   - Patient had multiple TIAs in September 2021, unclear if this is due to severe anemia due to concurrent GI bleeding, vertebral artery stenosis, or thrombosis related to essential thrombocytosis; no other known thromboembolic events - History of vasomotor symptoms include burning sensation of bilateral plantar feet (while he was off of aspirin for GI bleeding), resolved after restarting aspirin - Repeat bone marrow biopsy (08/16/2020): Markedly hypercellular bone marrow with pan myelo proliferation, including increased number of megakaryocytes with abnormal morphology; associated with mild reticulin fibrosis consistent with polycythemia vera or early primary myelofibrosis; definitive significant collagenous fibrosis is not seen; no increase in blast cells identified; abnormal plasma cells as further discussed below FISH analysis (plasma cell myeloma prognostic panel) showed CCND1/IgH t(11;14) Cytogenetics revealed normal male karyotype - CBC (09/16/2020) shows increase in all three cell lines with WBC 17.0 (ANC 11.2), platelets 733, and RBC 7.64 (Hgb 14.1/MCV 66.8)   2.  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: - Bone marrow biopsy (08/16/2020): Plasma cells are increased, representing 5%  of all cells in bone marrow aspirate and representing 10% by CD138 immunohistochemistry, showing kappa light chain restriction consistent with plasma cell neoplasm FISH analysis (plasma cell myeloma prognostic panel) showed CCND1/IgH t(11;14) Cytogenetics revealed normal male karyotype - Free light chains (07/27/2020) shows markedly elevated kappa light chains 354, normal lambda light chain 6.0, elevated ratio 59.0 (SPEP and immunofixation normal) - Asymptomatic apart from general fatigue (energy 75%) - Most recent labs (09/16/2020) show Hgb 14.1, creatinine 1.16, and calcium 8.7   3.  Iron deficiency anemia secondary to GI blood loss - Patient had severe GI bleed in September 2021 (duodenal ulcer secondary to NSAID use) with hemoglobin < 6.0, required RBC transfusion x2 - Hgb remained low 6 months after GI bleed (07/27/2020) with Hgb 10.7, MCV 62.0 - Severe iron deficiency was noted (07/27/2020) with ferritin 3, iron saturation 4%, serum iron 21 - Received IV Venofer 1200 mg in 4 divided doses (April 2022) - Denies current signs or symptoms of blood loss - Repeat CBC (09/16/2020) shows improved Hgb 14.1 - Repeat iron panel (09/16/2020) with ferritin 9, iron saturation 6%, serum iron 28   4.  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  JAK2 positive myeloproliferative disorder: - He was evaluated by [facility].  I have reviewed records from [facility] along with labs. - He was recommended to start on hydroxyurea.  He started it around the beginning of this month.  So far he is tolerating it very well at 100 mg daily dose. - He will continue follow-ups with Dr. Mariea Clonts at [facility].   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 Aris Lot at [facility].    3.  Iron deficiency anemia secondary to GI blood loss - He has received parenteral iron therapy and his hemoglobin improved to normal.   4.  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  Hemoglobin A was 96.9%.  Orders placed this encounter:  No orders of the defined types were placed in this encounter.    Derek Jack, MD [place] [phone]   I, Thana Ates, am acting as a scribe for Dr. Derek Jack.  I, Derek Jack MD, have reviewed the above documentation for accuracy and completeness, and I agree with the above.

## 2020-12-07 ENCOUNTER — Inpatient Hospital Stay (HOSPITAL_COMMUNITY): Payer: 59

## 2020-12-07 ENCOUNTER — Encounter (HOSPITAL_COMMUNITY): Payer: Self-pay

## 2020-12-07 ENCOUNTER — Inpatient Hospital Stay (HOSPITAL_COMMUNITY): Payer: 59 | Attending: Hematology | Admitting: Hematology

## 2020-12-07 VITALS — BP 155/90 | HR 61 | Temp 97.9°F | Resp 16 | Wt 200.0 lb

## 2020-12-07 DIAGNOSIS — Z5112 Encounter for antineoplastic immunotherapy: Secondary | ICD-10-CM | POA: Diagnosis present

## 2020-12-07 DIAGNOSIS — K922 Gastrointestinal hemorrhage, unspecified: Secondary | ICD-10-CM | POA: Diagnosis not present

## 2020-12-07 DIAGNOSIS — D472 Monoclonal gammopathy: Secondary | ICD-10-CM | POA: Diagnosis not present

## 2020-12-07 DIAGNOSIS — D45 Polycythemia vera: Secondary | ICD-10-CM | POA: Insufficient documentation

## 2020-12-07 DIAGNOSIS — D500 Iron deficiency anemia secondary to blood loss (chronic): Secondary | ICD-10-CM | POA: Insufficient documentation

## 2020-12-07 DIAGNOSIS — C9000 Multiple myeloma not having achieved remission: Secondary | ICD-10-CM | POA: Diagnosis present

## 2020-12-07 NOTE — Patient Instructions (Signed)
[facility] at [facility] Discharge Instructions  You were seen today by Dr. Katragadda. He went over your recent results. Dr. Katragadda will see you back in for labs and follow up.   Thank you for choosing [facility] at [facility] to provide your oncology and hematology care.  To afford each patient quality time with our provider, please arrive at least 15 minutes before your scheduled appointment time.   If you have a lab appointment with the Cancer Center please come in thru the Main Entrance and check in at the main information desk  You need to re-schedule your appointment should you arrive 10 or more minutes late.  We strive to give you quality time with our providers, and arriving late affects you and other patients whose appointments are after yours.  Also, if you no show three or more times for appointments you may be dismissed from the clinic at the providers discretion.     Again, thank you for choosing [facility].  Our hope is that these requests will decrease the amount of time that you wait before being seen by our physicians.       _____________________________________________________________  Should you have questions after your visit to [facility], please contact our office at [phone] between the hours of 8:00 a.m. and 4:30 p.m.  Voicemails left after 4:00 p.m. will not be returned until the following business day.  For prescription refill requests, have your pharmacy contact our office and allow 72 hours.    Cancer Center Support Programs:   > Cancer Support Group  2nd Tuesday of the month 1pm-2pm, Journey Room    

## 2021-02-14 NOTE — Progress Notes (Signed)
David Roberson, [place] [id number]   CLINIC:  Medical Oncology/Hematology  PCP:  [name] [address] / [place] [address] [phone]   REASON FOR VISIT:  Follow-up for polycythemia vera, iron deficiency anemia, and possible plasma cell dyscrasia  PRIOR THERAPY: Intermittent IV iron infusions  CURRENT THERAPY: surveillance  BRIEF ONCOLOGIC HISTORY:  Oncology History  Multiple myeloma without remission ([name])  08/16/2020 Bone Marrow Biopsy   BONE MARROW, ASPIRATE, CLOT, CORE:  -Hypercellular bone marrow with myeloproliferative neoplasm  -Plasma cell neoplasm  -See comment   PERIPHERAL BLOOD:  -Microcytic-hypochromic anemia  -Leukocytosis  -Thrombocytosis   COMMENT:   The bone marrow is markedly hypercellular with pan myeloid proliferation including increased number of megakaryocytes with abnormal morphology. No increase in blastic cells identified.  This is associated with mild reticulin fibrosis.  Given the previously documented positivity for JAK2 mutation in peripheral blood, the overall myeloid changes are consistent with a myeloproliferative neoplasm.  In this setting, the main considerations include iron-deficient polycythemia vera, early primary myelofibrosis.  In addition, the plasma cells are increased representing 5% of all cells in the aspirate associated with numerous predominantly small clusters in the clot and biopsy sections.  The plasma cells represent an estimated 10% by CD138 immunohistochemistry and show kappa light chain restriction consistent with plasma cell neoplasm. Correlation with cytogenetic and FISH studies is recommended.   10/05/2020 Imaging   US abdomen 1. Splenomegaly with splenic volume of 1210.2 cm 2. 1.6 cm indeterminate lesion within the inferior spleen. Consider further evaluation with MRI   10/05/2020 Imaging   MR total spine 1. Diffuse abnormal T1 hypointense marrow signal throughout  the cervical, thoracic, lumbar and visualized sacral spine compatible with the given history. There are subcentimeter lesions within the T10 and T11 vertebral bodies which demonstrate precontrast T1 hyperintensity and are favored to reflect hemangiomas. A 3 mm enhancing lesion within the L3 vertebral body has no convincing precontrast T1 hyperintensity. This lesion may reflect a lipid-poor hemangioma or a small multiple myeloma lesion. Interval MRI follow-up is recommended to ensure stability. 2. At C5-C6, there is multifactorial severe spinal canal stenosis with at least mild spinal cord flattening. Apparent T2 hyperintense signal abnormality within the right aspect of the spinal cord at this level, which may reflect myelomalacia or focal edema. Bilateral neural foraminal narrowing also present at this level (severe right, moderate left). 3. Thoracic spondylosis, as outlined. No more than mild relative spinal canal stenosis is appreciated. No compressive neural foraminal narrowing identified. 4. Lumbar spondylosis, as outlined. No more than mild central canal stenosis is appreciated. Neural foraminal narrowing bilaterally at L3-L4 (mild) and bilaterally at L4-L5 (mild right, mild/moderate left.   10/07/2020 Imaging   No focal lytic or blastic bone lesions identified. Incidental findings as noted above.   10/13/2020 Initial Diagnosis   Multiple myeloma without remission ([name])   11/08/2020 -  Chemotherapy    Patient is on Treatment Plan: MYELOMA  RVD SQ Q21D X 4 CYCLES         CANCER STAGING: Cancer Staging No matching staging information was found for the patient.  INTERVAL HISTORY:  [name], a 55 y.o. male, returns for routine follow-up of his polycythemia vera, iron deficiency anemia, and possible plasma cell dyscrasia. David Roberson was last seen on 12/07/2020.   Today he reports feeling well and he is accompanied by his wife. He reports constant fatigue which worsens later in the  day; the fatigue  was present prior to hydrea, but it has worsened since starting hydrea. He also complains of frequent urination. He denies night sweats and fevers. He reports tightness in his legs bilaterally.   REVIEW OF SYSTEMS:  Review of Systems  Constitutional:  Positive for fatigue (40%). Negative for appetite change and fever.  Gastrointestinal:  Positive for nausea.  Genitourinary:  Positive for frequency.   Neurological:  Positive for dizziness.  Psychiatric/Behavioral:  Positive for depression and sleep disturbance. The patient is nervous/anxious.   All other systems reviewed and are negative.  PAST MEDICAL/SURGICAL HISTORY:  Past Medical History:  Diagnosis Date   Anemia    Blood transfusion without reported diagnosis    Diverticulitis    GERD (gastroesophageal reflux disease)    GI bleed    Hypertension    PONV (postoperative nausea and vomiting)    with colonoscopy   Past Surgical History:  Procedure Laterality Date   ANKLE FRACTURE SURGERY Left    COLONOSCOPY N/A 01/30/2017   Procedure: COLONOSCOPY;  Surgeon: Danie Binder, MD;  [facility]: [facility];  Service: Endoscopy;  Laterality: N/A;  [place] / REPAIR  2014   LIPOMA EXCISION     Neck   POLYPECTOMY  01/30/2017   Procedure: POLYPECTOMY;  Surgeon: Danie Binder, MD;  Location: [facility];  Service: Endoscopy;;  Ascending colon   VASECTOMY  2005    SOCIAL HISTORY:  Social History   Socioeconomic History   Marital status: Married    Spouse name: Not on file   Number of children: 4   Years of education: Not on file   Highest education level: Not on file  Occupational History   Occupation: Retired  Tobacco Use   Smoking status: Never   Smokeless tobacco: Former    Types: Chew    Quit date: 01/22/2020  Vaping Use   Vaping Use: Never used  Substance and Sexual Activity   Alcohol use: Yes    Comment: maybe 1 beer a night   Drug use: No   Sexual activity: Yes  Other Topics  Concern   Not on file  Social History Narrative   Not on file   Social Determinants of Health   Financial Resource Strain: Low Risk    Difficulty of Paying Living Expenses: Not hard at all  Food Insecurity: No Food Insecurity   Worried About Charity fundraiser in the Last Year: Never true   [place] in the Last Year: Never true  Transportation Needs: No Transportation Needs   Lack of Transportation (Medical): No   Lack of Transportation (Non-Medical): No  Physical Activity: Insufficiently Active   Days of Exercise per Week: 2 days   Minutes of Exercise per Session: 30 min  Stress: No Stress Concern Present   Feeling of Stress : Not at all  Social Connections: Moderately Isolated   Frequency of Communication with Friends and Family: More than three times a week   Frequency of Social Gatherings with Friends and Family: Three times a week   Attends Religious Services: Never   Active Member of Clubs or Organizations: No   Attends Archivist Meetings: Never   Marital Status: Married  Human resources officer Violence: Not At Risk   Fear of Current or Ex-Partner: No   Emotionally Abused: No   Physically Abused: No   Sexually Abused: No    FAMILY HISTORY:  Family History  Problem Relation Age of Onset   Hypertension  Mother    Hypertension Father    Stroke Paternal Grandmother    Heart disease Paternal Grandfather     CURRENT MEDICATIONS:  Current Outpatient Medications  Medication Sig Dispense Refill   acyclovir (ZOVIRAX) 400 MG tablet Take 1 tablet (400 mg total) by mouth 2 (two) times daily. 60 tablet 3   ALPRAZolam (XANAX) 0.25 MG tablet Take 1 tablet by mouth as needed.     amLODipine (NORVASC) 10 MG tablet Take 10 mg by mouth daily.     aspirin EC 81 MG tablet Take 81 mg by mouth daily. Swallow whole.     BORTEZOMIB IJ Inject as directed once a week. Days 1, 8, 15 every 21 days     Cholecalciferol 10 MCG (400 UNIT) CHEW Chew by mouth.     clopidogrel  (PLAVIX) 75 MG tablet Take 1 tablet by mouth daily.     dexamethasone (DECADRON) 4 MG tablet Take 20 mg once a week by mouth with food 20 tablet 3   FLORASTOR 250 MG capsule Take 250 mg by mouth at bedtime.     guanFACINE (INTUNIV) 1 MG TB24 ER tablet Take 1 tablet by mouth daily.     hydroxyurea (HYDREA) 500 MG capsule Take 500 mg by mouth daily.     lenalidomide (REVLIMID) 20 MG capsule Take 1 capsule (20 mg total) by mouth daily. 14 days on, 7 days off 14 capsule 0   Nebivolol HCl 20 MG TABS Take 1 tablet by mouth daily.     NP THYROID 60 MG tablet Take 60 mg by mouth daily.     Omega-3 Fatty Acids (FISH OIL) 1000 MG CAPS Take by mouth.     ondansetron (ZOFRAN) 8 MG tablet Take 1 tablet (8 mg total) by mouth 2 (two) times daily as needed (Nausea or vomiting). 30 tablet 1   pantoprazole (PROTONIX) 40 MG tablet Take 40 mg by mouth daily.     prochlorperazine (COMPAZINE) 10 MG tablet TAKE 1 TABLET(10 MG) BY MOUTH EVERY 6 HOURS AS NEEDED FOR NAUSEA OR VOMITING 30 tablet 1   rosuvastatin (CRESTOR) 40 MG tablet Take 1 tablet by mouth daily.     sulfacetamide (BLEPH-10) 10 % ophthalmic solution      tiZANidine (ZANAFLEX) 4 MG tablet Take 4 mg by mouth 3 (three) times daily.     Vitamin D, Cholecalciferol, 25 MCG (1000 UT) TABS Take 1,000 Units by mouth daily.     No current facility-administered medications for this visit.    ALLERGIES:  Allergies  Allergen Reactions   Hydralazine Other (See Comments)   Losartan Other (See Comments)    Caused elevated potassium   Piperacillin Itching   Piperacillin Sod-Tazobactam So Itching   Metronidazole Itching and Rash   Piperacillin-Tazobactam In Dex Rash    PHYSICAL EXAM:  Performance status (ECOG): 1 - Symptomatic but completely ambulatory  There were no vitals filed for this visit. Wt Readings from Last 3 Encounters:  12/07/20 200 lb (90.7 kg)  11/08/20 203 lb 3.2 oz (92.2 kg)  10/27/20 203 lb 14.4 oz (92.5 kg)   Physical Exam Vitals  reviewed.  Constitutional:      Appearance: Normal appearance.  Cardiovascular:     Rate and Rhythm: Normal rate and regular rhythm.     Pulses: Normal pulses.     Heart sounds: Normal heart sounds.  Pulmonary:     Effort: Pulmonary effort is normal.     Breath sounds: Normal breath sounds.  Abdominal:  Palpations: Abdomen is soft. There is splenomegaly (6 finger breadths below costal margin). There is no hepatomegaly or mass.     Tenderness: There is no abdominal tenderness.  Neurological:     General: No focal deficit present.     Mental Status: He is alert and oriented to person, place, and time.  Psychiatric:        Mood and Affect: Mood normal.        Behavior: Behavior normal.     LABORATORY DATA:  I have reviewed the labs as listed.  CBC Latest Ref Rng & Units 11/08/2020 09/16/2020 08/16/2020  WBC 4.0 - 10.5 K/uL 19.7(H) 17.0(H) 18.5(H)  Hemoglobin 13.0 - 17.0 g/dL 17.7(H) 14.1 11.9(L)  Hematocrit 39.0 - 52.0 % 62.7(H) 51.0 44.5  Platelets 150 - 400 K/uL 662(H) 733(H) 974(HH)   CMP Latest Ref Rng & Units 11/08/2020 09/16/2020 07/27/2020  Glucose 70 - 99 mg/dL 101(H) 103(H) 88  BUN 6 - 20 mg/dL _0 Creatinine 0.61 - 1.24 mg/dL 0.92 1.16 1.04  Sodium 135 - 145 mmol/L 136 135 138  Potassium 3.5 - 5.1 mmol/L 4.5 4.0 4.7  Chloride 98 - 111 mmol/L 102 104 105  CO2 22 - 32 mmol/L _1 Calcium 8.9 - 10.3 mg/dL 9.4 8.7(L) 9.1  Total Protein 6.5 - 8.1 g/dL 6.8 6.7 6.9  Total Bilirubin 0.3 - 1.2 mg/dL 0.6 0.7 0.7  Alkaline Phos 38 - 126 U/L 81 73 67  AST 15 - 41 U/L _2 ALT 0 - 44 U/L _3 DIAGNOSTIC IMAGING:  I have independently reviewed the scans and discussed with the patient. No results found.   ASSESSMENT:  1.  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   - Patient was briefly on Hydroxyurea for less than 2 months in 2018, has been on long-term Aspirin since diagnosis.   - Patient had multiple TIAs in September 2021, unclear if this is due to severe anemia due to concurrent GI bleeding, vertebral artery stenosis, or thrombosis related to essential thrombocytosis; no other known thromboembolic events - History of vasomotor symptoms include burning sensation of bilateral plantar feet (while he was off of aspirin for GI bleeding), resolved after restarting aspirin - Repeat bone marrow biopsy (08/16/2020): Markedly hypercellular bone marrow with pan myelo proliferation, including increased number of megakaryocytes with abnormal morphology; associated with mild reticulin fibrosis consistent with polycythemia vera or early primary myelofibrosis; definitive significant collagenous fibrosis is not seen; no increase in blast cells identified; abnormal plasma cells as further discussed below FISH analysis (plasma cell myeloma prognostic panel) showed CCND1/IgH t(11;14) Cytogenetics revealed normal male karyotype - CBC (09/16/2020) shows increase in all three cell lines with WBC 17.0 (ANC 11.2), platelets 733, and RBC 7.64 (Hgb 14.1/MCV 66.8)   2.  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: - Bone marrow biopsy (08/16/2020): Plasma cells are increased, representing 5% of all cells in bone marrow aspirate and representing 10% by CD138 immunohistochemistry, showing kappa light chain restriction consistent with plasma cell neoplasm FISH analysis (plasma cell myeloma prognostic panel) showed CCND1/IgH t(11;14) Cytogenetics revealed normal male karyotype - Free light  chains (07/27/2020) shows markedly elevated kappa light  chains 354, normal lambda light chain 6.0, elevated ratio 59.0 (SPEP and immunofixation normal) - Asymptomatic apart from general fatigue (energy 75%) - Most recent labs (09/16/2020) show Hgb 14.1, creatinine 1.16, and calcium 8.7   3.  Iron deficiency anemia secondary to GI blood loss - Patient had severe GI bleed in September 2021 (duodenal ulcer secondary to NSAID use) with hemoglobin < 6.0, required RBC transfusion x2 - Hgb remained low 6 months after GI bleed (07/27/2020) with Hgb 10.7, MCV 62.0 - Severe iron deficiency was noted (07/27/2020) with ferritin 3, iron saturation 4%, serum iron 21 - Received IV Venofer 1200 mg in 4 divided doses (April 2022) - Denies current signs or symptoms of blood loss - Repeat CBC (09/16/2020) shows improved Hgb 14.1 - Repeat iron panel (09/16/2020) with ferritin 9, iron saturation 6%, serum iron 28   4.  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  JAK2 positive myeloproliferative disorder: - He was started on hydroxyurea 1000 mg daily under the direction of Dr. Mariea Clonts at [facility]. - He complains of fatigue, more prominent by the end of the day. - He had 2 phlebotomies done.  Last CBC from 02/01/2021 at [facility] reviewed by me shows hematocrit 52.  Platelet count 188.  White count is 11.2. - We have talked about his hematological condition in detail.  He will follow-up with Dr. Mariea Clonts next week at [facility].  He reports early satiety.  Spleen is palpable about 6 fingerbreadths below the costal margin.  His weight has been stable.  No severe night sweats or other constitutional symptoms. - If symptoms any worse, JAK2 inhibitor is likely option. - He will continue follow-ups with Dr. Mariea Clonts.  He wanted to have was fill some forms for Social Security  disability.  He will follow-up with [place] as needed.   2.  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  He will continue follow-ups with Dr. Aris Lot.   3.  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  Hemoglobin A was 96.9%.   Orders placed this encounter:  No orders of the defined types were placed in this encounter.    Derek Jack, MD [place] [phone]   I, Thana Ates, am acting as a scribe for Dr. Derek Jack.  I, Derek Jack MD, have reviewed the above documentation for accuracy and completeness, and I agree with the above.

## 2021-02-15 ENCOUNTER — Other Ambulatory Visit: Payer: Self-pay

## 2021-02-15 ENCOUNTER — Inpatient Hospital Stay (HOSPITAL_COMMUNITY): Payer: 59 | Attending: Hematology | Admitting: Hematology

## 2021-02-15 VITALS — BP 144/96 | HR 71 | Temp 99.0°F | Resp 18 | Wt 201.9 lb

## 2021-02-15 DIAGNOSIS — C9000 Multiple myeloma not having achieved remission: Secondary | ICD-10-CM | POA: Diagnosis present

## 2021-02-15 DIAGNOSIS — D472 Monoclonal gammopathy: Secondary | ICD-10-CM

## 2021-02-15 DIAGNOSIS — D45 Polycythemia vera: Secondary | ICD-10-CM | POA: Diagnosis present

## 2021-02-15 DIAGNOSIS — K922 Gastrointestinal hemorrhage, unspecified: Secondary | ICD-10-CM | POA: Diagnosis not present

## 2021-02-15 DIAGNOSIS — D500 Iron deficiency anemia secondary to blood loss (chronic): Secondary | ICD-10-CM | POA: Insufficient documentation

## 2021-02-15 NOTE — Patient Instructions (Signed)
[place] at [facility] Discharge Instructions  You were seen and examined today by Dr. Delton Coombes. If you start having symptoms like fever 100.4 F or night sweats please let Dr. Aris Lot know. Start taking the Hydrea in the morning to see if that helps with the frequent urination at night. Walking and being active will help with the fatigue you are having. Please follow up as scheduled with Dr. Aris Lot.    Thank you for choosing [place] at [facility] to provide your oncology and hematology care.  To afford each patient quality time with our provider, please arrive at least 15 minutes before your scheduled appointment time.   If you have a lab appointment with the [place] please come in thru the Main Entrance and check in at the main information desk.  You need to re-schedule your appointment should you arrive 10 or more minutes late.  We strive to give you quality time with our providers, and arriving late affects you and other patients whose appointments are after yours.  Also, if you no show three or more times for appointments you may be dismissed from the clinic at the providers discretion.     Again, thank you for choosing [facility].  Our hope is that these requests will decrease the amount of time that you wait before being seen by our physicians.       _____________________________________________________________  Should you have questions after your visit to [facility] please contact our office at [phone] and follow the prompts.  Our office hours are 8:00 a.m. and 4:30 p.m. Monday - Friday.  Please note that voicemails left after 4:00 p.m. may not be returned until the following business day.  We are closed weekends and major holidays.  You do have access to a nurse 24-7, just call the main number to the clinic [phone] and do not press any options, hold on the line and a nurse will answer the  phone.    For prescription refill requests, have your pharmacy contact our office and allow 72 hours.    Due to Covid, you will need to wear a mask upon entering the hospital. If you do not have a mask, a mask will be given to you at the Main Entrance upon arrival. For doctor visits, patients may have 1 support person age 7 or older with them. For treatment visits, patients can not have anyone with them due to social distancing guidelines and our immunocompromised population.

## 2021-11-23 IMAGING — CT CT ASPRIRATION BONE MARROW
1 of 2 series · 15 of 29 positions shown, 19 images · non-contrast
Comparison: none

INDICATION: Myelofibrosis, thrombocytosis

[Series 2: i-spiral 5.0 br40 · axial · 0.98mm/px · z∈[-431,-323]mm · 15 of 35 slices shown, 19 images]
[im 2/35  mediastinal]
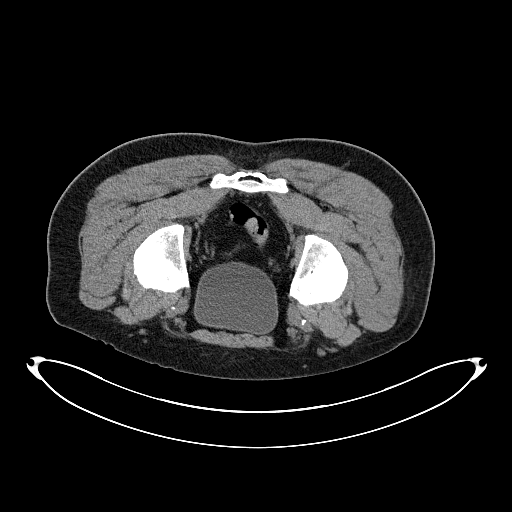
[im 2/35  lung]
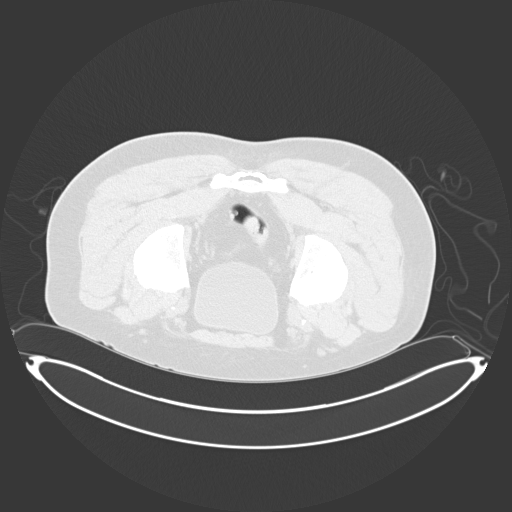
[im 5/35  lung]
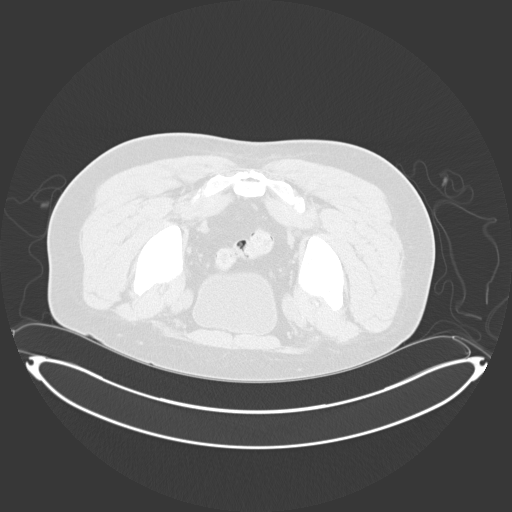
[im 7/35  lung]
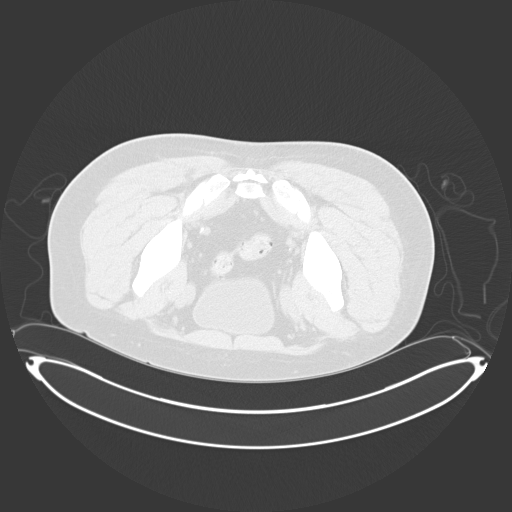
[im 9/35  lung]
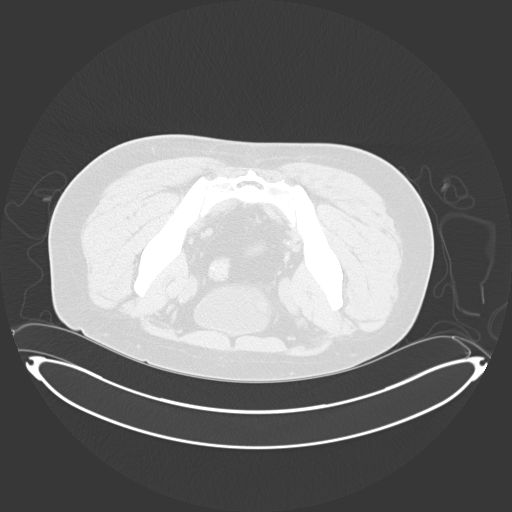
[im 11/35  mediastinal]
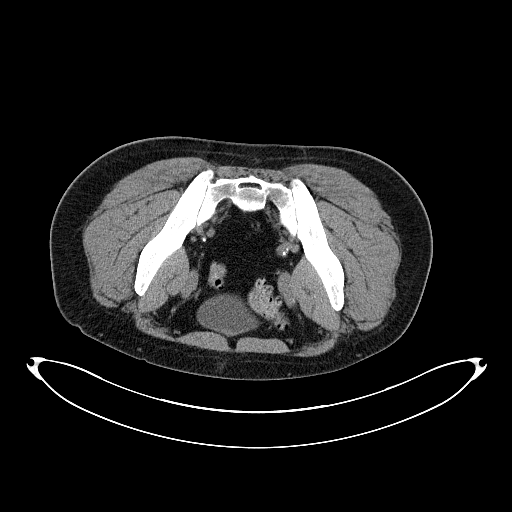
[im 11/35  lung]
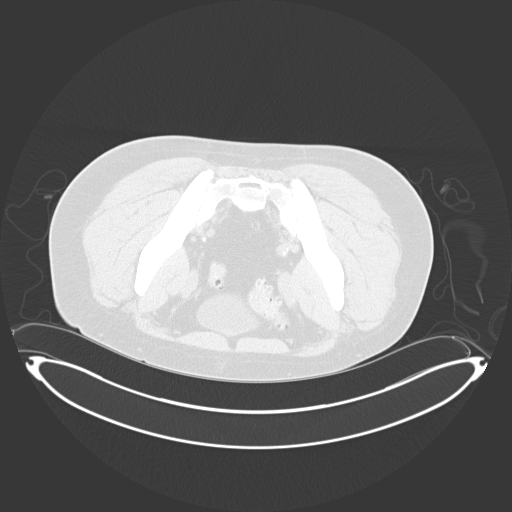
[im 14/35  lung]
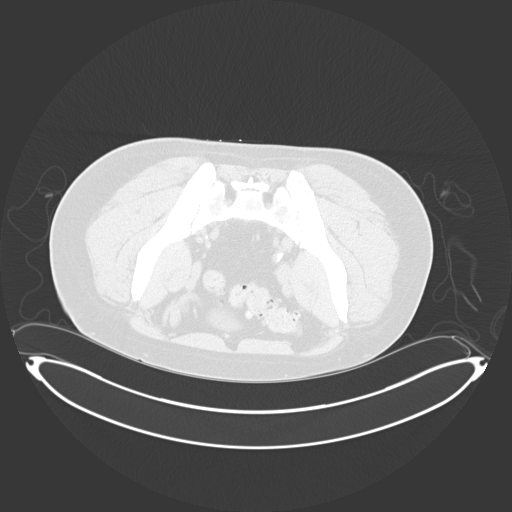
[im 15/35  lung]
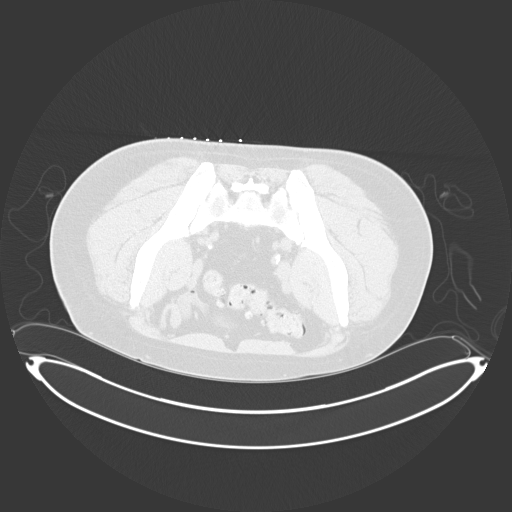
[im 17/35  lung]
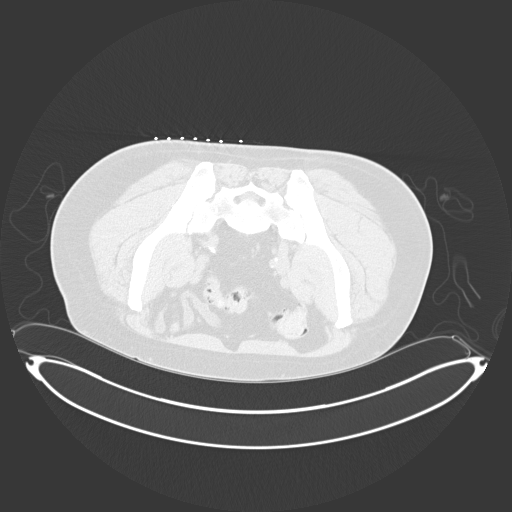
[im 20/35  mediastinal]
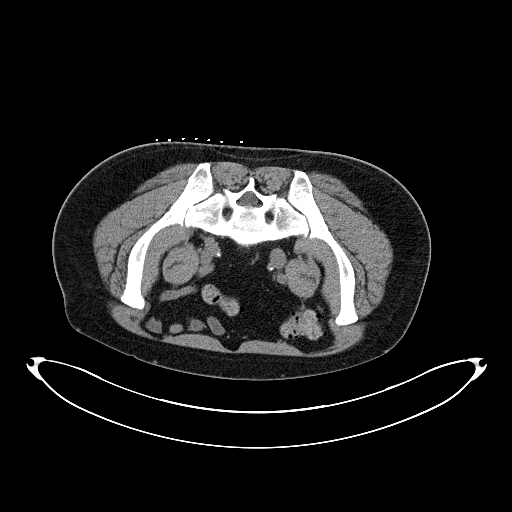
[im 20/35  lung]
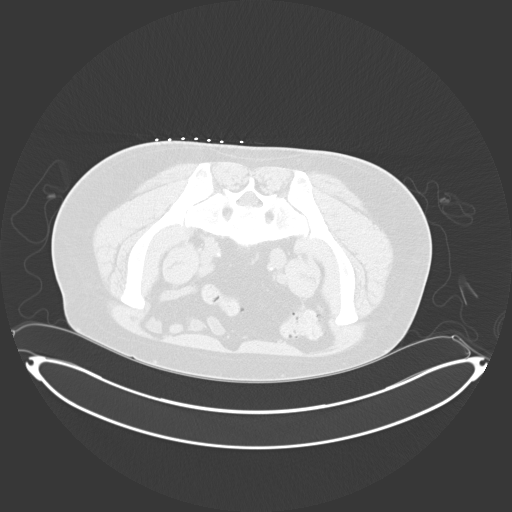
[im 21/35  lung]
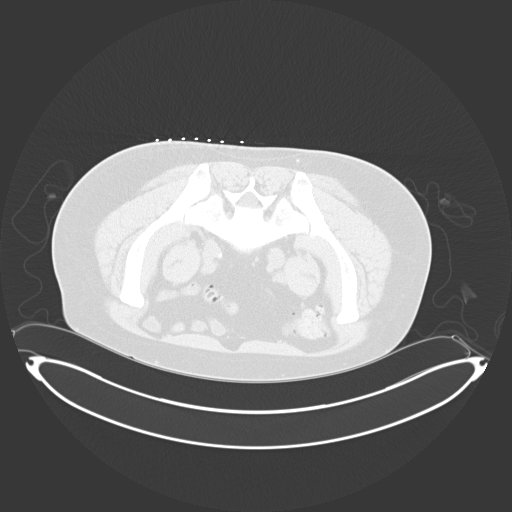
[im 24/35  lung]
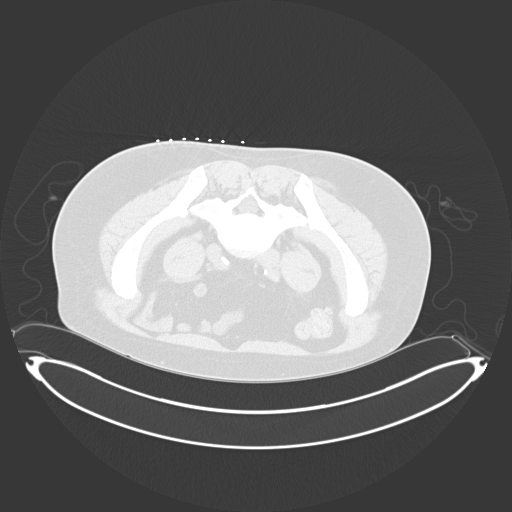
[im 26/35  lung]
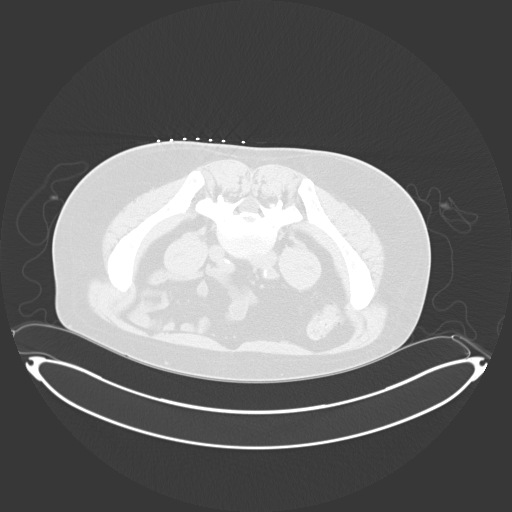
[im 28/35  mediastinal]
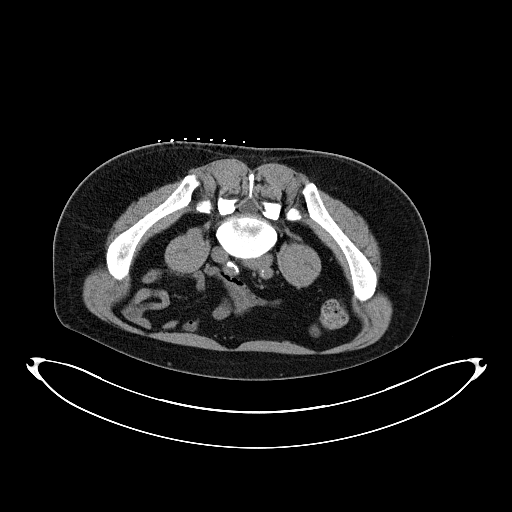
[im 28/35  lung]
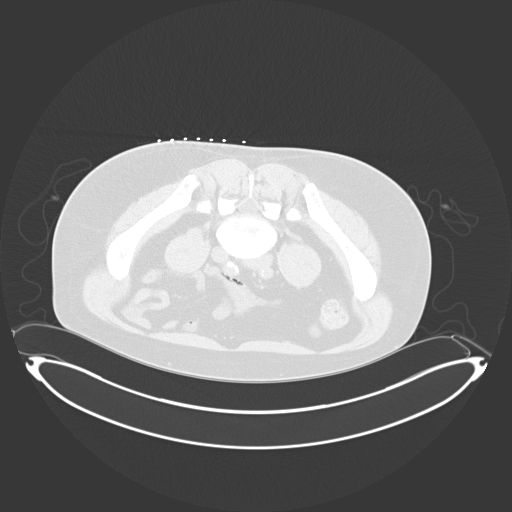
[im 30/35  lung]
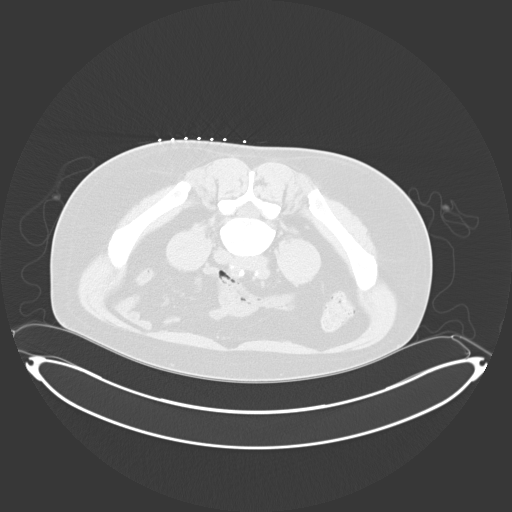
[im 33/35  lung]
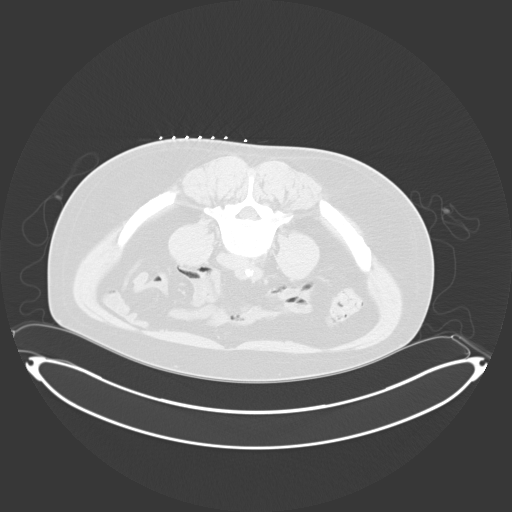

[15 of 29 positions shown; findings below may reference images not displayed]

EXAM:
CT GUIDED RIGHT ILIAC BONE MARROW ASPIRATION AND CORE BIOPSY

Radiologist:  Guin, Dieuquimaitre

Guidance:  CT

FLUOROSCOPY TIME:  Fluoroscopy Time: None.

MEDICATIONS:
1% lidocaine local

ANESTHESIA/SEDATION:
4.0 mg IV Versed; 150 mcg IV Fentanyl

Moderate Sedation Time:  10 minutes

The patient was continuously monitored during the procedure by the
interventional radiology nurse under my direct supervision.

CONTRAST:  None.

COMPLICATIONS:
None

PROCEDURE:
Informed consent was obtained from the patient following explanation
of the procedure, risks, benefits and alternatives. The patient
understands, agrees and consents for the procedure. All questions
were addressed. A time out was performed.

The patient was positioned prone and non-contrast localization CT
was performed of the pelvis to demonstrate the iliac marrow spaces.

Maximal barrier sterile technique utilized including caps, mask,
sterile gowns, sterile gloves, large sterile drape, hand hygiene,
and Betadine prep.

Under sterile conditions and local anesthesia, an 11 gauge coaxial
bone biopsy needle was advanced into the right iliac marrow space.
Needle position was confirmed with CT imaging. Initially, bone
marrow aspiration was performed. Next, the 11 gauge outer cannula
was utilized to obtain a right iliac bone marrow core biopsy. Needle
was removed. Hemostasis was obtained with compression. The patient
tolerated the procedure well. Samples were prepared with the
cytotechnologist. No immediate complications.
IMPRESSION: CT guided right iliac bone marrow aspiration and core biopsy.

## 2022-01-09 ENCOUNTER — Encounter: Payer: Self-pay | Admitting: *Deleted

## 2022-01-26 IMAGING — MR MR ABDOMEN WO/W CM
18 series · 48 of 48 positions shown · IV contrast (9 GADAVIST)
Comparison: Abdominal ultrasound, 10/05/2020

CLINICAL DATA: Characterize inferior splenic lesion identified by
prior ultrasound

EXAM:
MRI ABDOMEN WITHOUT AND WITH CONTRAST
TECHNIQUE: Multiplanar multisequence MR imaging of the abdomen was performed
both before and after the administration of intravenous contrast.
CONTRAST:  9mL GADAVIST GADOBUTROL 1 MMOL/ML IV SOLN

[Series 3: DWI · axial · 6.0mm · 1.49mm/px · z∈[-153,+156]mm · 4 of 88 slices shown (1 of 2)]
[im 1/88]
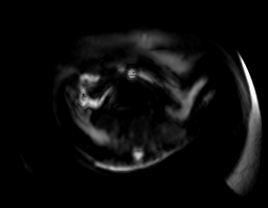
[im 30/88]
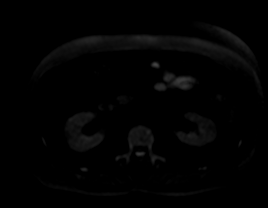
[im 59/88]
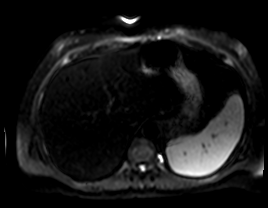
[im 88/88]
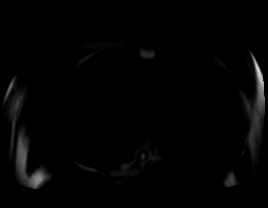

[Series 4: DWI · axial · 6.0mm · 1.49mm/px · z∈[-153,+156]mm · 2 of 44 slices shown (2 of 2)]
[im 1/44]
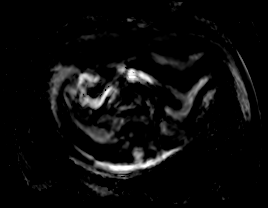
[im 44/44]
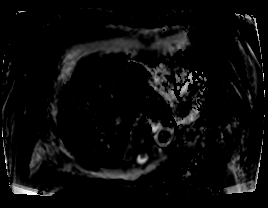

[Series 5: T2 fat-sat · axial · 6.0mm · 1.25mm/px · z∈[-153,+142]mm · 2 of 42 slices shown]
[im 1/42]
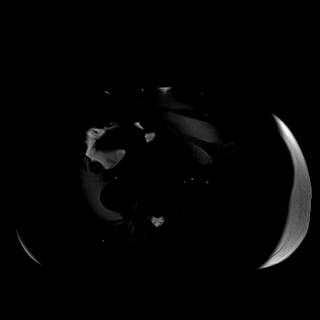
[im 42/42]
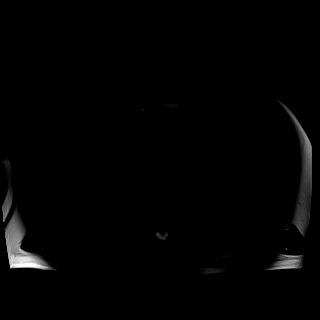

[Series 7: cor haste · coronal · 6.0mm · 1.56mm/px · 1 of 39 slices shown]
[im 1/39]
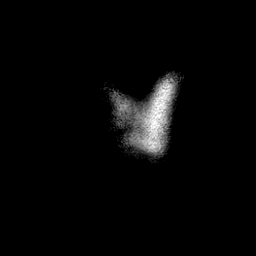

[Series 8: bSSFP · axial · 6.0mm · 2.08mm/px · z∈[-164,+148]mm · 2 of 53 slices shown]
[im 1/53]
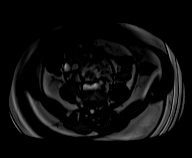
[im 53/53]
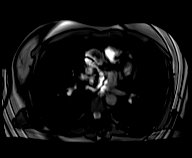

[Series 9: T1 dynamic · axial · 3.0mm · 1.39mm/px · z∈[-150,+135]mm · 3 of 96 slices shown (1 of 9)]
[im 1/96]
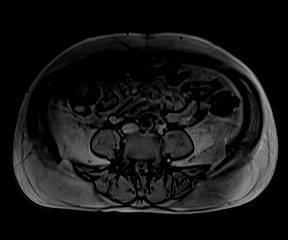
[im 48/96]
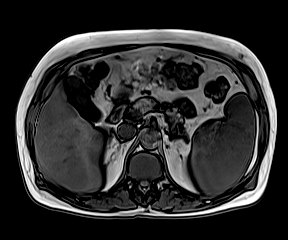
[im 96/96]
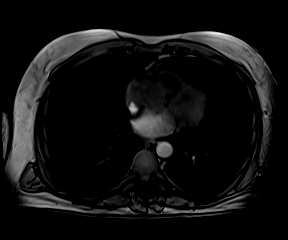

[Series 10: T1 dynamic · axial · 3.0mm · 1.39mm/px · z∈[-150,+135]mm · 3 of 96 slices shown (2 of 9)]
[im 1/96]
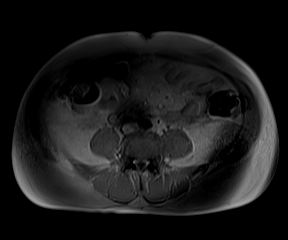
[im 48/96]
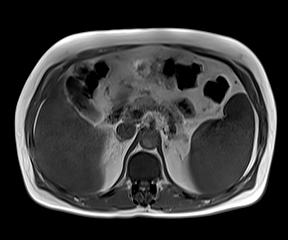
[im 96/96]
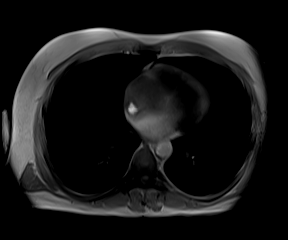

[Series 17: T1 dynamic · axial · 3.0mm · 1.39mm/px · z∈[-150,+135]mm · 3 of 96 slices shown (3 of 9)]
[im 1/96]
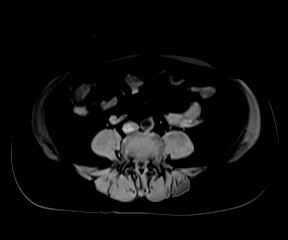
[im 48/96]
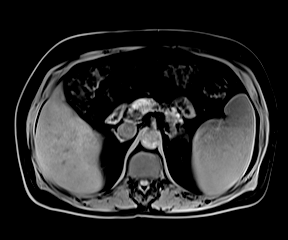
[im 96/96]
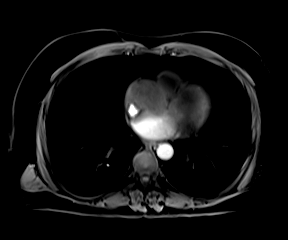

[Series 19: T1 dynamic · axial · 3.0mm · 1.39mm/px · z∈[-150,+135]mm · 3 of 96 slices shown (4 of 9)]
[im 1/96]
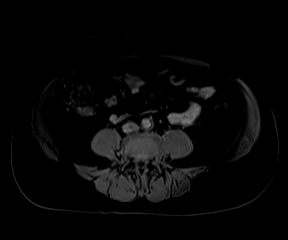
[im 48/96]
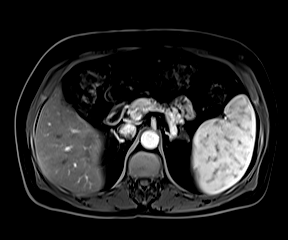
[im 96/96]
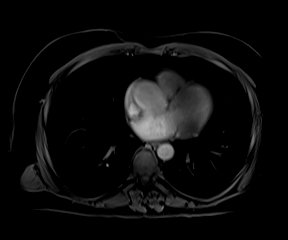

[Series 20: T1 dynamic · axial · 3.0mm · 1.39mm/px · z∈[-150,+135]mm · 3 of 96 slices shown (5 of 9)]
[im 1/96]
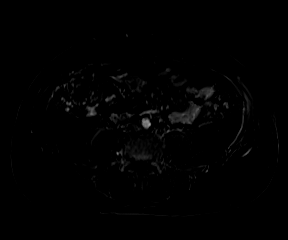
[im 48/96]
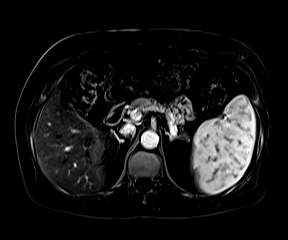
[im 96/96]
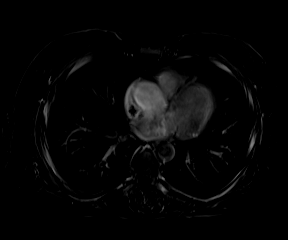

[Series 22: T1 dynamic · axial · 3.0mm · 1.39mm/px · z∈[-150,+135]mm · 3 of 96 slices shown (6 of 9)]
[im 1/96]
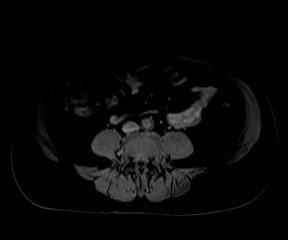
[im 48/96]
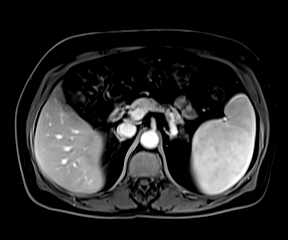
[im 96/96]
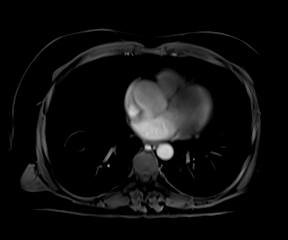

[Series 23: T1 dynamic · axial · 3.0mm · 1.39mm/px · z∈[-150,+135]mm · 3 of 96 slices shown (7 of 9)]
[im 1/96]
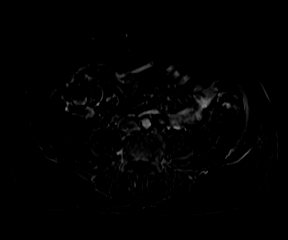
[im 48/96]
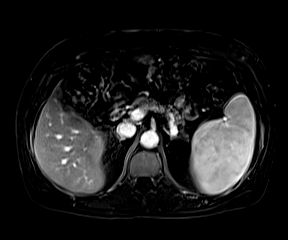
[im 96/96]
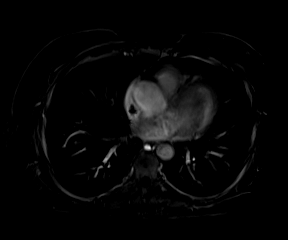

[Series 25: T1 dynamic · axial · 3.0mm · 1.39mm/px · z∈[-150,+135]mm · 3 of 96 slices shown (8 of 9)]
[im 1/96]
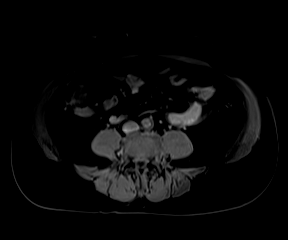
[im 48/96]
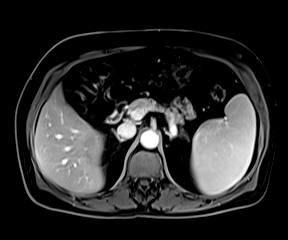
[im 96/96]
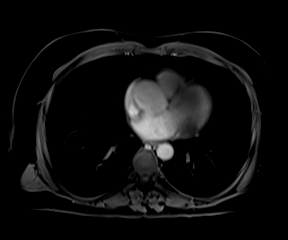

[Series 26: T1 dynamic · axial · 3.0mm · 1.39mm/px · z∈[-150,+135]mm · 3 of 96 slices shown (9 of 9)]
[im 1/96]
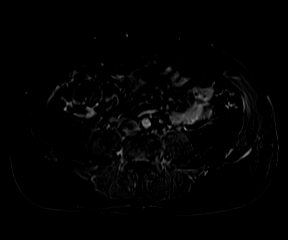
[im 48/96]
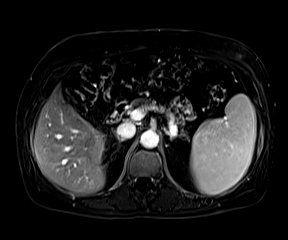
[im 96/96]
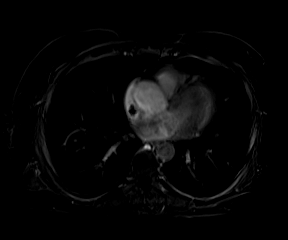

[Series 27: ax_haste_mbh · axial · 6.0mm · 1.25mm/px · 1 of 44 slices shown]
[im 1/44]
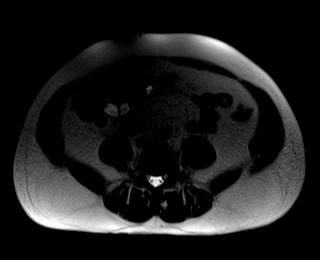

[Series 29: cor_vibe_dixon_delayed_w · coronal · 3.0mm · 2.34mm/px · 3 of 96 slices shown]
[im 1/96]
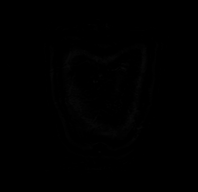
[im 48/96]
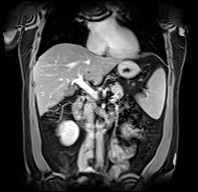
[im 96/96]
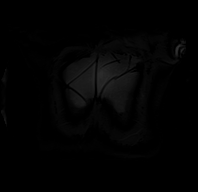

[Series 31: ax_dixon_delayed_w_reg · axial · 3.0mm · 1.39mm/px · z∈[-150,+135]mm · 3 of 96 slices shown]
[im 1/96]
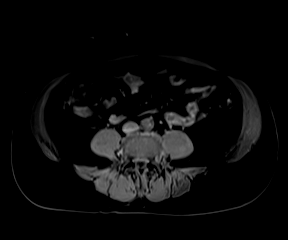
[im 48/96]
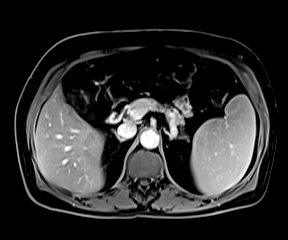
[im 96/96]
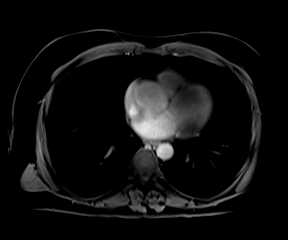

[Series 32: ax_dixon_delayed_w_reg_sub · axial · 3.0mm · 1.39mm/px · z∈[-150,+135]mm · 3 of 96 slices shown]
[im 1/96]
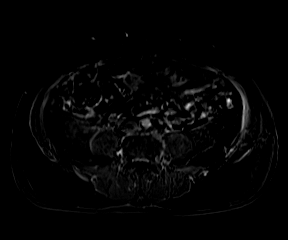
[im 48/96]
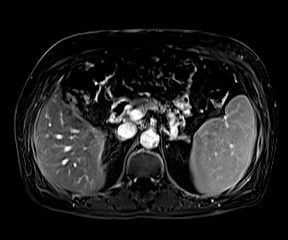
[im 96/96]
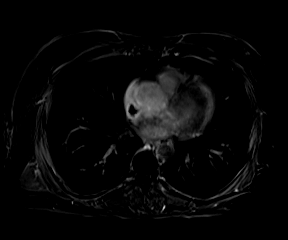

[48 of 48 positions shown; findings below may reference images not displayed]

FINDINGS: Lower chest: No acute findings.

Hepatobiliary: No mass or other parenchymal abnormality identified.

Pancreas: No mass, inflammatory changes, or other parenchymal
abnormality identified.

Spleen:  Splenomegaly, maximum coronal span 15.1 cm.

Adrenals/Urinary Tract: No solid masses identified. There is an
intrinsically T1 hyperintense, hemorrhagic or proteinaceous cyst of
the posterior midportion of the left kidney without evidence of
contrast enhancement (series 17, image 77). No evidence of
hydronephrosis.

Stomach/Bowel: Visualized portions within the abdomen are
unremarkable.

Vascular/Lymphatic: No pathologically enlarged lymph nodes
identified. No abdominal aortic aneurysm demonstrated. Aortic
atherosclerosis.

Other:  None.

Musculoskeletal: No suspicious bone lesions identified.
IMPRESSION: 1. Splenomegaly, maximum coronal span 15.1 cm.
2. No focal lesion of the spleen to correspond to finding of prior
ultrasound, of doubtful clinical significance.

Aortic Atherosclerosis ([id number]).

## 2022-06-23 DEATH — deceased
